# Patient Record
Sex: Male | Born: 1950 | Race: Black or African American | Hispanic: No | Marital: Single | Smoking: Former smoker
Health system: Southern US, Community
[De-identification: ages and names within clinical notes are randomized; demographics above are authoritative.]

## PROBLEM LIST (undated history)

## (undated) DIAGNOSIS — C801 Malignant (primary) neoplasm, unspecified: Secondary | ICD-10-CM

## (undated) DIAGNOSIS — D649 Anemia, unspecified: Secondary | ICD-10-CM

## (undated) DIAGNOSIS — I10 Essential (primary) hypertension: Secondary | ICD-10-CM

## (undated) DIAGNOSIS — I2510 Atherosclerotic heart disease of native coronary artery without angina pectoris: Secondary | ICD-10-CM

## (undated) DIAGNOSIS — Z930 Tracheostomy status: Secondary | ICD-10-CM

---

## 2005-12-24 ENCOUNTER — Emergency Department: Payer: Self-pay | Admitting: Emergency Medicine

## 2020-05-22 DIAGNOSIS — J9621 Acute and chronic respiratory failure with hypoxia: Secondary | ICD-10-CM

## 2020-05-22 DIAGNOSIS — C321 Malignant neoplasm of supraglottis: Secondary | ICD-10-CM | POA: Diagnosis not present

## 2020-05-22 DIAGNOSIS — I779 Disorder of arteries and arterioles, unspecified: Secondary | ICD-10-CM

## 2020-05-22 DIAGNOSIS — Z5181 Encounter for therapeutic drug level monitoring: Secondary | ICD-10-CM | POA: Diagnosis not present

## 2020-05-23 DIAGNOSIS — Z5181 Encounter for therapeutic drug level monitoring: Secondary | ICD-10-CM | POA: Diagnosis not present

## 2020-05-23 DIAGNOSIS — C321 Malignant neoplasm of supraglottis: Secondary | ICD-10-CM | POA: Diagnosis not present

## 2020-05-23 DIAGNOSIS — J9621 Acute and chronic respiratory failure with hypoxia: Secondary | ICD-10-CM | POA: Diagnosis not present

## 2020-05-23 DIAGNOSIS — I779 Disorder of arteries and arterioles, unspecified: Secondary | ICD-10-CM | POA: Diagnosis not present

## 2020-05-24 DIAGNOSIS — I779 Disorder of arteries and arterioles, unspecified: Secondary | ICD-10-CM | POA: Diagnosis not present

## 2020-05-24 DIAGNOSIS — J9621 Acute and chronic respiratory failure with hypoxia: Secondary | ICD-10-CM

## 2020-05-24 DIAGNOSIS — C321 Malignant neoplasm of supraglottis: Secondary | ICD-10-CM | POA: Diagnosis not present

## 2020-05-24 DIAGNOSIS — Z5181 Encounter for therapeutic drug level monitoring: Secondary | ICD-10-CM | POA: Diagnosis not present

## 2020-05-25 DIAGNOSIS — Z5181 Encounter for therapeutic drug level monitoring: Secondary | ICD-10-CM

## 2020-05-25 DIAGNOSIS — C321 Malignant neoplasm of supraglottis: Secondary | ICD-10-CM | POA: Diagnosis not present

## 2020-05-25 DIAGNOSIS — I779 Disorder of arteries and arterioles, unspecified: Secondary | ICD-10-CM

## 2020-05-25 DIAGNOSIS — J9621 Acute and chronic respiratory failure with hypoxia: Secondary | ICD-10-CM

## 2020-05-26 DIAGNOSIS — C321 Malignant neoplasm of supraglottis: Secondary | ICD-10-CM | POA: Diagnosis not present

## 2020-05-26 DIAGNOSIS — Z5181 Encounter for therapeutic drug level monitoring: Secondary | ICD-10-CM

## 2020-05-26 DIAGNOSIS — J9621 Acute and chronic respiratory failure with hypoxia: Secondary | ICD-10-CM

## 2020-05-26 DIAGNOSIS — I779 Disorder of arteries and arterioles, unspecified: Secondary | ICD-10-CM

## 2020-05-27 DIAGNOSIS — I779 Disorder of arteries and arterioles, unspecified: Secondary | ICD-10-CM | POA: Diagnosis not present

## 2020-05-27 DIAGNOSIS — Z5181 Encounter for therapeutic drug level monitoring: Secondary | ICD-10-CM

## 2020-05-27 DIAGNOSIS — C321 Malignant neoplasm of supraglottis: Secondary | ICD-10-CM

## 2020-05-27 DIAGNOSIS — J9621 Acute and chronic respiratory failure with hypoxia: Secondary | ICD-10-CM | POA: Diagnosis not present

## 2020-05-28 DIAGNOSIS — Z5181 Encounter for therapeutic drug level monitoring: Secondary | ICD-10-CM

## 2020-05-28 DIAGNOSIS — C321 Malignant neoplasm of supraglottis: Secondary | ICD-10-CM | POA: Diagnosis not present

## 2020-05-28 DIAGNOSIS — I779 Disorder of arteries and arterioles, unspecified: Secondary | ICD-10-CM | POA: Diagnosis not present

## 2020-05-28 DIAGNOSIS — J9621 Acute and chronic respiratory failure with hypoxia: Secondary | ICD-10-CM

## 2020-05-29 DIAGNOSIS — Z5181 Encounter for therapeutic drug level monitoring: Secondary | ICD-10-CM

## 2020-05-29 DIAGNOSIS — C321 Malignant neoplasm of supraglottis: Secondary | ICD-10-CM

## 2020-05-29 DIAGNOSIS — J9621 Acute and chronic respiratory failure with hypoxia: Secondary | ICD-10-CM

## 2020-05-29 DIAGNOSIS — I779 Disorder of arteries and arterioles, unspecified: Secondary | ICD-10-CM

## 2020-05-30 DIAGNOSIS — Z5181 Encounter for therapeutic drug level monitoring: Secondary | ICD-10-CM | POA: Diagnosis not present

## 2020-05-30 DIAGNOSIS — C321 Malignant neoplasm of supraglottis: Secondary | ICD-10-CM

## 2020-05-30 DIAGNOSIS — J9621 Acute and chronic respiratory failure with hypoxia: Secondary | ICD-10-CM | POA: Diagnosis not present

## 2020-05-30 DIAGNOSIS — I779 Disorder of arteries and arterioles, unspecified: Secondary | ICD-10-CM | POA: Diagnosis not present

## 2020-06-07 DIAGNOSIS — Z5181 Encounter for therapeutic drug level monitoring: Secondary | ICD-10-CM | POA: Diagnosis not present

## 2020-06-07 DIAGNOSIS — C321 Malignant neoplasm of supraglottis: Secondary | ICD-10-CM | POA: Diagnosis not present

## 2020-06-07 DIAGNOSIS — J9621 Acute and chronic respiratory failure with hypoxia: Secondary | ICD-10-CM

## 2020-06-07 DIAGNOSIS — I779 Disorder of arteries and arterioles, unspecified: Secondary | ICD-10-CM

## 2020-06-08 DIAGNOSIS — I779 Disorder of arteries and arterioles, unspecified: Secondary | ICD-10-CM

## 2020-06-08 DIAGNOSIS — J9621 Acute and chronic respiratory failure with hypoxia: Secondary | ICD-10-CM | POA: Diagnosis not present

## 2020-06-08 DIAGNOSIS — C321 Malignant neoplasm of supraglottis: Secondary | ICD-10-CM | POA: Diagnosis not present

## 2020-06-08 DIAGNOSIS — Z5181 Encounter for therapeutic drug level monitoring: Secondary | ICD-10-CM | POA: Diagnosis not present

## 2020-06-09 DIAGNOSIS — J9621 Acute and chronic respiratory failure with hypoxia: Secondary | ICD-10-CM | POA: Diagnosis not present

## 2020-06-09 DIAGNOSIS — Z5181 Encounter for therapeutic drug level monitoring: Secondary | ICD-10-CM

## 2020-06-09 DIAGNOSIS — I779 Disorder of arteries and arterioles, unspecified: Secondary | ICD-10-CM | POA: Diagnosis not present

## 2020-06-09 DIAGNOSIS — C321 Malignant neoplasm of supraglottis: Secondary | ICD-10-CM

## 2020-06-10 DIAGNOSIS — C321 Malignant neoplasm of supraglottis: Secondary | ICD-10-CM

## 2020-06-10 DIAGNOSIS — Z5181 Encounter for therapeutic drug level monitoring: Secondary | ICD-10-CM | POA: Diagnosis not present

## 2020-06-10 DIAGNOSIS — J9621 Acute and chronic respiratory failure with hypoxia: Secondary | ICD-10-CM

## 2020-06-10 DIAGNOSIS — I779 Disorder of arteries and arterioles, unspecified: Secondary | ICD-10-CM | POA: Diagnosis not present

## 2020-06-11 DIAGNOSIS — C321 Malignant neoplasm of supraglottis: Secondary | ICD-10-CM

## 2020-06-11 DIAGNOSIS — J9621 Acute and chronic respiratory failure with hypoxia: Secondary | ICD-10-CM | POA: Diagnosis not present

## 2020-06-11 DIAGNOSIS — Z5181 Encounter for therapeutic drug level monitoring: Secondary | ICD-10-CM | POA: Diagnosis not present

## 2020-06-11 DIAGNOSIS — I779 Disorder of arteries and arterioles, unspecified: Secondary | ICD-10-CM

## 2020-06-24 ENCOUNTER — Emergency Department (HOSPITAL_COMMUNITY): Payer: Medicare Other

## 2020-06-24 ENCOUNTER — Inpatient Hospital Stay (HOSPITAL_COMMUNITY)
Admission: EM | Admit: 2020-06-24 | Discharge: 2020-06-28 | DRG: 480 | Disposition: A | Discharge status: Other Acute Inpatient Hospital | Payer: Medicare Other | Attending: Internal Medicine | Admitting: Internal Medicine

## 2020-06-24 ENCOUNTER — Other Ambulatory Visit: Payer: Self-pay

## 2020-06-24 DIAGNOSIS — I110 Hypertensive heart disease with heart failure: Secondary | ICD-10-CM | POA: Diagnosis present

## 2020-06-24 DIAGNOSIS — I482 Chronic atrial fibrillation, unspecified: Secondary | ICD-10-CM | POA: Diagnosis present

## 2020-06-24 DIAGNOSIS — R1310 Dysphagia, unspecified: Secondary | ICD-10-CM | POA: Diagnosis present

## 2020-06-24 DIAGNOSIS — I5032 Chronic diastolic (congestive) heart failure: Secondary | ICD-10-CM | POA: Diagnosis present

## 2020-06-24 DIAGNOSIS — I480 Paroxysmal atrial fibrillation: Secondary | ICD-10-CM | POA: Diagnosis present

## 2020-06-24 DIAGNOSIS — Z951 Presence of aortocoronary bypass graft: Secondary | ICD-10-CM

## 2020-06-24 DIAGNOSIS — E440 Moderate protein-calorie malnutrition: Secondary | ICD-10-CM | POA: Insufficient documentation

## 2020-06-24 DIAGNOSIS — W010XXA Fall on same level from slipping, tripping and stumbling without subsequent striking against object, initial encounter: Secondary | ICD-10-CM | POA: Diagnosis present

## 2020-06-24 DIAGNOSIS — S72141A Displaced intertrochanteric fracture of right femur, initial encounter for closed fracture: Secondary | ICD-10-CM | POA: Diagnosis present

## 2020-06-24 DIAGNOSIS — I5033 Acute on chronic diastolic (congestive) heart failure: Secondary | ICD-10-CM

## 2020-06-24 DIAGNOSIS — Y92121 Bathroom in nursing home as the place of occurrence of the external cause: Secondary | ICD-10-CM

## 2020-06-24 DIAGNOSIS — Z09 Encounter for follow-up examination after completed treatment for conditions other than malignant neoplasm: Secondary | ICD-10-CM

## 2020-06-24 DIAGNOSIS — E785 Hyperlipidemia, unspecified: Secondary | ICD-10-CM | POA: Diagnosis present

## 2020-06-24 DIAGNOSIS — I509 Heart failure, unspecified: Secondary | ICD-10-CM

## 2020-06-24 DIAGNOSIS — J449 Chronic obstructive pulmonary disease, unspecified: Secondary | ICD-10-CM | POA: Diagnosis present

## 2020-06-24 DIAGNOSIS — I10 Essential (primary) hypertension: Secondary | ICD-10-CM | POA: Diagnosis present

## 2020-06-24 DIAGNOSIS — Z20822 Contact with and (suspected) exposure to covid-19: Secondary | ICD-10-CM | POA: Diagnosis present

## 2020-06-24 DIAGNOSIS — I2510 Atherosclerotic heart disease of native coronary artery without angina pectoris: Secondary | ICD-10-CM | POA: Diagnosis present

## 2020-06-24 DIAGNOSIS — S72001A Fracture of unspecified part of neck of right femur, initial encounter for closed fracture: Secondary | ICD-10-CM

## 2020-06-24 DIAGNOSIS — I252 Old myocardial infarction: Secondary | ICD-10-CM

## 2020-06-24 DIAGNOSIS — I5043 Acute on chronic combined systolic (congestive) and diastolic (congestive) heart failure: Secondary | ICD-10-CM | POA: Diagnosis present

## 2020-06-24 DIAGNOSIS — S72091A Other fracture of head and neck of right femur, initial encounter for closed fracture: Secondary | ICD-10-CM | POA: Diagnosis not present

## 2020-06-24 DIAGNOSIS — C091 Malignant neoplasm of tonsillar pillar (anterior) (posterior): Secondary | ICD-10-CM | POA: Diagnosis present

## 2020-06-24 DIAGNOSIS — Z930 Tracheostomy status: Secondary | ICD-10-CM

## 2020-06-24 DIAGNOSIS — E119 Type 2 diabetes mellitus without complications: Secondary | ICD-10-CM | POA: Diagnosis present

## 2020-06-24 DIAGNOSIS — I083 Combined rheumatic disorders of mitral, aortic and tricuspid valves: Secondary | ICD-10-CM | POA: Diagnosis present

## 2020-06-24 DIAGNOSIS — Z6829 Body mass index (BMI) 29.0-29.9, adult: Secondary | ICD-10-CM

## 2020-06-24 DIAGNOSIS — R491 Aphonia: Secondary | ICD-10-CM | POA: Diagnosis present

## 2020-06-24 DIAGNOSIS — Z01810 Encounter for preprocedural cardiovascular examination: Secondary | ICD-10-CM | POA: Diagnosis not present

## 2020-06-24 DIAGNOSIS — T1490XA Injury, unspecified, initial encounter: Secondary | ICD-10-CM

## 2020-06-24 DIAGNOSIS — I340 Nonrheumatic mitral (valve) insufficiency: Secondary | ICD-10-CM | POA: Diagnosis not present

## 2020-06-24 DIAGNOSIS — I361 Nonrheumatic tricuspid (valve) insufficiency: Secondary | ICD-10-CM | POA: Diagnosis not present

## 2020-06-24 DIAGNOSIS — M25551 Pain in right hip: Secondary | ICD-10-CM | POA: Diagnosis present

## 2020-06-24 LAB — CBC WITH DIFFERENTIAL/PLATELET
Abs Immature Granulocytes: 0.01 10*3/uL (ref 0.00–0.07)
Basophils Absolute: 0 10*3/uL (ref 0.0–0.1)
Basophils Relative: 0 %
Eosinophils Absolute: 0 10*3/uL (ref 0.0–0.5)
Eosinophils Relative: 0 %
HCT: 29.2 % — ABNORMAL LOW (ref 39.0–52.0)
Hemoglobin: 9 g/dL — ABNORMAL LOW (ref 13.0–17.0)
Immature Granulocytes: 0 %
Lymphocytes Relative: 11 %
Lymphs Abs: 0.6 10*3/uL — ABNORMAL LOW (ref 0.7–4.0)
MCH: 24.7 pg — ABNORMAL LOW (ref 26.0–34.0)
MCHC: 30.8 g/dL (ref 30.0–36.0)
MCV: 80 fL (ref 80.0–100.0)
Monocytes Absolute: 0.7 10*3/uL (ref 0.1–1.0)
Monocytes Relative: 14 %
Neutro Abs: 3.9 10*3/uL (ref 1.7–7.7)
Neutrophils Relative %: 75 %
Platelets: 253 10*3/uL (ref 150–400)
RBC: 3.65 MIL/uL — ABNORMAL LOW (ref 4.22–5.81)
RDW: 16.8 % — ABNORMAL HIGH (ref 11.5–15.5)
WBC: 5.2 10*3/uL (ref 4.0–10.5)
nRBC: 0 % (ref 0.0–0.2)

## 2020-06-24 LAB — BASIC METABOLIC PANEL
Anion gap: 8 (ref 5–15)
BUN: 21 mg/dL (ref 8–23)
CO2: 30 mmol/L (ref 22–32)
Calcium: 9.1 mg/dL (ref 8.9–10.3)
Chloride: 97 mmol/L — ABNORMAL LOW (ref 98–111)
Creatinine, Ser: 0.72 mg/dL (ref 0.61–1.24)
GFR calc Af Amer: >60 mL/min (ref >60)
GFR calc non Af Amer: >60 mL/min (ref >60)
Glucose, Bld: 102 mg/dL — ABNORMAL HIGH (ref 70–99)
Potassium: 4.9 mmol/L (ref 3.5–5.1)
Sodium: 135 mmol/L (ref 135–145)

## 2020-06-24 LAB — TYPE AND SCREEN
ABO/RH(D): O POS
Antibody Screen: NEGATIVE

## 2020-06-24 LAB — SARS CORONAVIRUS 2 BY RT PCR (HOSPITAL ORDER, PERFORMED IN ~~LOC~~ HOSPITAL LAB): SARS Coronavirus 2: NEGATIVE

## 2020-06-24 LAB — HIV ANTIBODY (ROUTINE TESTING W REFLEX): HIV Screen 4th Generation wRfx: NONREACTIVE

## 2020-06-24 LAB — BRAIN NATRIURETIC PEPTIDE: B Natriuretic Peptide: 187.9 pg/mL — ABNORMAL HIGH (ref 0.0–100.0)

## 2020-06-24 LAB — ABO/RH: ABO/RH(D): O POS

## 2020-06-24 MED ORDER — HYDROCODONE-ACETAMINOPHEN 5-325 MG PO TABS
1.0000 | ORAL_TABLET | Freq: Four times a day (QID) | ORAL | Status: DC | PRN
  Administered 2020-06-25: 2 via ORAL
  Filled 2020-06-24: qty 2

## 2020-06-24 MED ORDER — HYDRALAZINE HCL 20 MG/ML IJ SOLN: 5.0000 mg | Freq: Four times a day (QID) | INTRAMUSCULAR | Status: DC | PRN

## 2020-06-24 MED ORDER — ENOXAPARIN SODIUM 40 MG/0.4ML ~~LOC~~ SOLN
40.0000 mg | SUBCUTANEOUS | Status: DC
  Filled 2020-06-24: qty 0.4

## 2020-06-24 MED ORDER — CARVEDILOL 25 MG PO TABS
25.0000 mg | ORAL_TABLET | Freq: Two times a day (BID) | ORAL | Status: DC
  Administered 2020-06-25 – 2020-06-28 (×7): 25 mg via ORAL
  Filled 2020-06-24 (×7): qty 1

## 2020-06-24 MED ORDER — IPRATROPIUM-ALBUTEROL 0.5-2.5 (3) MG/3ML IN SOLN
RESPIRATORY_TRACT | Status: AC
  Filled 2020-06-24: qty 3

## 2020-06-24 MED ORDER — FUROSEMIDE 40 MG PO TABS
80.0000 mg | ORAL_TABLET | Freq: Every day | ORAL | Status: DC
  Administered 2020-06-25 – 2020-06-28 (×4): 80 mg via ORAL
  Filled 2020-06-24 (×4): qty 2

## 2020-06-24 MED ORDER — FUROSEMIDE 20 MG PO TABS: 80.0000 mg | ORAL_TABLET | Freq: Every day | ORAL | Status: DC

## 2020-06-24 MED ORDER — ONDANSETRON HCL 4 MG/2ML IJ SOLN
4.0000 mg | Freq: Four times a day (QID) | INTRAMUSCULAR | Status: DC | PRN
  Administered 2020-06-25: 4 mg via INTRAVENOUS

## 2020-06-24 MED ORDER — MOMETASONE FURO-FORMOTEROL FUM 100-5 MCG/ACT IN AERO
2.0000 | INHALATION_SPRAY | Freq: Two times a day (BID) | RESPIRATORY_TRACT | Status: DC
  Administered 2020-06-24: 2 via RESPIRATORY_TRACT
  Filled 2020-06-24: qty 8.8

## 2020-06-24 MED ORDER — LISINOPRIL 5 MG PO TABS
5.0000 mg | ORAL_TABLET | Freq: Every day | ORAL | Status: DC
  Administered 2020-06-24 – 2020-06-28 (×5): 5 mg via ORAL
  Filled 2020-06-24 (×5): qty 1

## 2020-06-24 MED ORDER — SODIUM CHLORIDE 0.9 % IV SOLN: INTRAVENOUS | Status: DC

## 2020-06-24 MED ORDER — IPRATROPIUM-ALBUTEROL 0.5-2.5 (3) MG/3ML IN SOLN
3.0000 mL | Freq: Four times a day (QID) | RESPIRATORY_TRACT | Status: DC | PRN
  Administered 2020-06-24 – 2020-06-25 (×2): 3 mL via RESPIRATORY_TRACT
  Filled 2020-06-24: qty 3

## 2020-06-24 MED ORDER — CEFAZOLIN SODIUM-DEXTROSE 2-4 GM/100ML-% IV SOLN
2.0000 g | INTRAVENOUS | Status: AC
  Administered 2020-06-25: 2 g via INTRAVENOUS

## 2020-06-24 MED ORDER — ATORVASTATIN CALCIUM 80 MG PO TABS
80.0000 mg | ORAL_TABLET | Freq: Every day | ORAL | Status: DC
  Administered 2020-06-24 – 2020-06-28 (×5): 80 mg via ORAL
  Filled 2020-06-24 (×5): qty 1

## 2020-06-24 MED ORDER — ACETYLCYSTEINE 20 % IN SOLN
4.0000 mL | Freq: Three times a day (TID) | RESPIRATORY_TRACT | Status: DC
  Administered 2020-06-24 – 2020-06-25 (×2): 4 mL via RESPIRATORY_TRACT
  Filled 2020-06-24 (×4): qty 4

## 2020-06-24 MED ORDER — FUROSEMIDE 10 MG/ML IJ SOLN
80.0000 mg | Freq: Once | INTRAMUSCULAR | Status: AC
  Administered 2020-06-24: 80 mg via INTRAVENOUS
  Filled 2020-06-24: qty 8

## 2020-06-24 MED ORDER — MORPHINE SULFATE (PF) 4 MG/ML IV SOLN
4.0000 mg | INTRAVENOUS | Status: AC | PRN
  Administered 2020-06-24 – 2020-06-25 (×2): 4 mg via INTRAVENOUS
  Filled 2020-06-24 (×2): qty 1

## 2020-06-24 NOTE — H&P (View-Only) (Signed)
ORTHOPAEDIC CONSULTATION  REQUESTING PHYSICIAN: Lequita Halt, MD  Chief Complaint: right hip pain  HPI: Paul Lowery is a 69 y.o. 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  Patient currently resides at [place]. He had a reported fall last night. He was taken to [facility] Emergency Department today by EMS. Xrays in emergency department showed right intertrochanteric femur fracture. Orthopedics was consulted.   Difficult to obtain history due to patient's aphonia and tracheostomy. Patient notes he is only have pain in his right hip. He notes he has surgery on his left lower extremity in the past, but difficult to get more information about this. Pain has improved with IV pain medication. Resides at [place]. Normally ambulates with cane/walker.  No past medical history on file.  Social History   Socioeconomic History  . Marital status: Unknown    Spouse name: Not on file  . Number of children: Not on file  . Years of education: Not on file  . Highest education level: Not on file  Occupational History  . Not on file  Tobacco Use  . Smoking status: Not on file  Substance and Sexual Activity  . Alcohol use: Not on file  . Drug use: Not on file  . Sexual activity: Not on file  Other Topics Concern  . Not on file  Social History Narrative  . Not on file   Social Determinants of Health   Financial Resource Strain:   . Difficulty of Paying Living Expenses: Not on file  Food Insecurity:   . Worried About Charity fundraiser in the Last Year: Not on file  . Ran Out of Food in the Last Year: Not on file  Transportation Needs:   . Lack of Transportation (Medical): Not on file  . Lack of Transportation (Non-Medical): Not on file  Physical Activity:   . Days of Exercise per  Week: Not on file  . Minutes of Exercise per Session: Not on file  Stress:   . Feeling of Stress : Not on file  Social Connections:   . Frequency of Communication with Friends and Family: Not on file  . Frequency of Social Gatherings with Friends and Family: Not on file  . Attends Religious Services: Not on file  . Active Member of Clubs or Organizations: Not on file  . Attends Archivist Meetings: Not on file  . Marital Status: Not on file   No family history on file. No Known Allergies Prior to Admission medications   Not on File   DG Chest 1 View  Result Date: 06/24/2020 CLINICAL DATA:  Fall, hip fracture EXAM: CHEST  1 VIEW COMPARISON:  Radiograph 04/15/2018 FINDINGS: Endotracheal tube terminates in the mid trachea, approximately 5.5 cm from the level of the carina. There are postsurgical changes from prior sternotomy and CABG including reinforced inferior sternal sutures. Telemetry leads overlie the chest. Cardiomegaly similar to comparison exams. Aorta is calcified. There are diffuse hazy opacities throughout the lungs with a slight basilar gradient as well as mild cephalization and indistinctness of the pulmonary vascularity. Suspect some mild septal thickening seen in the periphery. No pneumothorax or visible effusion. No focal consolidation. No acute osseous or soft tissue abnormality. Degenerative changes are present in the imaged spine and shoulders. IMPRESSION: 1. Endotracheal tube terminates in the mid trachea, approximately 5.5  cm from the level of the carina. 2. Findings could suggest CHF with mild pulmonary edema and stable cardiomegaly. 3. No visible pleural effusion, pneumothorax or focal consolidative process. 4. Prior sternotomy and CABG. 5.  Aortic Atherosclerosis (ICD10-I70.0). Electronically Signed   By: Lovena Le M.D.   On: 06/24/2020 17:01   DG Hip Unilat With Pelvis 2-3 Views Right  Result Date: 06/24/2020 CLINICAL DATA:  Positive hip fracture, mechanical  fall last night. EXAM: DG HIP (WITH OR WITHOUT PELVIS) 2-3V RIGHT COMPARISON:  None. FINDINGS: Comminuted intertrochanteric fracture of the right femur. Partially visualized surgical changes of the proximal left femur. Frontal views of the left femur otherwise grossly unremarkable. No hip dislocation bilaterally. No acute fracture of the bones of the pelvis. No diastasis. IMPRESSION: Comminuted intertrochanteric right femoral fracture. Electronically Signed   By: Iven Finn M.D.   On: 06/24/2020 17:03   Family History Reviewed and non-contributory, no pertinent history of problems with bleeding or anesthesia      Review of Systems 14 system ROS conducted and negative except for that noted in HPI   OBJECTIVE  Vitals: Patient Vitals for the past 8 hrs:  BP Temp Temp src Pulse Resp SpO2  06/24/20 1815 (!) 146/75 -- -- 67 15 96 %  06/24/20 1715 (!) 161/81 -- -- 81 11 98 %  06/24/20 1645 (!) 146/78 -- -- 83 (!) 27 99 %  06/24/20 1630 (!) 150/87 -- -- 70 (!) 25 95 %  06/24/20 1624 (!) 160/72 98.8 F (37.1 C) Oral 68 16 98 %   General: Alert, no acute distress Cardiovascular: Warm extremities noted Respiratory: Tracheostomy in place, No cyanosis, no use of accessory musculature GI: No organomegaly, abdomen is soft and non-tender Skin: No lesions in the area of chief complaint other than those listed below in MSK exam.  Neurologic: Sensation intact distally save for the below mentioned MSK exam Psychiatric: Patient is competent for consent with normal mood and affect Lymphatic: Mild lower extremity edema  Extremities  Bilateral upper extremities: actively moves upper extremities without any pain. NVI.  RLE: Slightly shortened and externally rotated. ROM deferred. Distal motor and sensory function intact. 2+ DP pulse with warm and well perfused digits. Compartments soft and compressible, with no pain on passive stretch. LLE: nontender to palpation throughout LLE. Tolerates gentle ROM.  NVI.     Test Results Imaging Xrays right hip show comminuted intertrochanteric femur fracture. Limited viewing of previous surgical fixation on left femur.   Labs cbc Recent Labs    06/24/20 1732  WBC 5.2  HGB 9.0*  HCT 29.2*  PLT 253    Labs inflam No results for input(s): CRP in the last 72 hours.  Invalid input(s): ESR  Labs coag No results for input(s): INR, PTT in the last 72 hours.  Invalid input(s): PT  Recent Labs    06/24/20 1732  NA 135  K 4.9  CL 97*  CO2 30  GLUCOSE 102*  BUN 21  CREATININE 0.72  CALCIUM 9.1     ASSESSMENT AND PLAN: 69 y.o. male with the following: right intertrochanteric femur fracture  Orthopedics recommends admission to a medical service and we will provide consultation and follow along.  Discussed the nature of the injury as well as the care with the patient as well as the family.  Discussed options and non-operative versus operative measures. Nonoperative measures are not well tolerated as patient's on bedrest for extended periods of time tend to develop secondary issues such as  pneumonia, urinary tract infections, bedsores and delirium.  Based on this our recommendation is for operative measures.  Understanding this the patient/family elected to proceed with operative measures.  The risks and benefits of  surgical intervention including infection, bleeding, nerve injury, periprosthetic fracture, the need for revision surgery, leg length discrepancy, gait change, blood clots, cardiopulmonary complications, morbidity, mortality, among others, and they were willing to proceed.    - Plan: Operative fixation tomorrow if cleared by hospitalist team - NPO at midnight -Medicine team to admit and perform pre-op clearance - Weight Bearing Status/Activity: will ammend WB status postop, bedrest for now - PT/OT post op - VTE Prophylaxis: SCDs for now - Pain control: PRN pain medications - Dispo: Likely to require Rehab or SNF placement  upon discharge.  - Contact information: Dr. Ophelia Charter, Noemi Chapel, PA-C   [place], [place] 06/24/2020 8:26 PM

## 2020-06-24 NOTE — Consult Note (Signed)
Cardiology Consultation:   Patient ID: Paul Lowery MRN: [id number]; DOB: 1951-08-02  Admit date: 06/24/2020 Date of Consult: 06/25/2020  Primary Care Provider: Benito Mccreedy, MD [facility] Cardiologist: None Primary cardiologist: Mamie Nick ([place]) [place] Electrophysiologist:  None    Patient Profile:   Paul Lowery is a 69 y.o. male with a hx of three-vessel CABG in 2019, ischemic cardiomyopathy/heart failure with midrange ejection fraction, hypertension, hyperlipidemia, chronic intermittent atrial fibrillation, COPD on room air, type 2 diabetes on oral therapy, and recently diagnosed tonsillar squamous cell carcinoma (T3N3M1 supraglottic undifferentiated carcinoma with right hilar avidity suspicious although not biopsy-proven for metastasis, status post palliative radiation and tracheostomy and PEG tube) who is being seen today for the evaluation of heart failure at the request of Dr. Roosevelt Locks.  History of Present Illness:   Paul Lowery was brought to the emergency department from [facility] today after a fall and was found to have right hip comminuted intertrochanteric fracture.  Emergency department notes indicate a tentative plan for surgery tomorrow (9/17) afternoon.  Cardiology is consulted for preoperative risk stratification and recommendations regarding medical management of chronic cardiac conditions.  Review of care everywhere records indicates that he suffered NSTEMI and subsequently underwent CABG on 03/05/2018 by Dr. Augustin Coupe at [facility].  He has a LIMA graft to his LAD and vein grafts to a right posterior lateral branch and OM 2.  Echo at the time of CABG showed LVEF 45 to 50%, regional wall motion abnormalities in the posterior and lateral walls, and no major valvular abnormalities. He developed atrial fibrillation in the postoperative period.  He was treated with Eliquis for stroke prevention for approximately 6 months after surgery, this was  subsequently discontinued as he had no recurrent atrial fibrillation.  Chronic medications include beta-blocker, ACE inhibitor, and Lasix 80 mg daily.    I interviewed Paul Lowery with his speaking valve in place.  Paul Lowery reports that he had a mechanical fall leading to his hip fracture.  He otherwise feels at baseline.  He has no exertional chest pain and does not currently have any chest pain, dyspnea, lightheadedness, or dizziness.  He takes Lasix daily and does not feel volume overloaded.  He does have a cough productive of scant sputum from his trach.  Home Medications:  . acetylcysteine (MUCOMYST) 10 % nebulizer solution Take 10 mLs by nebulization every 8 (eight) hours as needed 30 mL 0  . atorvastatin (LIPITOR) 80 MG tablet Take 1 tablet (80 mg total) by G tube once daily 30 tablet 0  . budesonide (PULMICORT) 0.5 mg/2 mL nebulizer solution Take 2 mLs (0.5 mg total) by nebulization 2 (two) times daily 60 mL 0  . carvedilol (COREG) 25 MG tablet Take 25 mg by G tube 2 (two) times daily with meals 0  . fluticasone propionate (FLONASE) 50 mcg/actuation nasal spray Place 2 sprays into both nostrils 2 (two) times daily as needed (congestion) 16 g 0  . formoterol (PERFOROMIST) 20 mcg/2 mL nebulizer solution Take 2 mLs (20 mcg total) by nebulization 2 (two) times daily 120 mL 0  . FUROsemide (LASIX) 80 MG tablet Take 1 tablet (80 mg total) by G tube once daily 0  . ipratropium-albuteroL (DUO-NEB) nebulizer solution Take 3 mLs by nebulization every 4 (four) hours as needed for Wheezing or Shortness of Breath  . lisinopriL (ZESTRIL) 5 MG tablet Take 5 mg by G tube once daily  . oxyCODONE (ROXICODONE) 5 MG immediate release tablet Take 1-2 tablets (5-10  mg total) by G tube every 4 (four) hours as needed for Pain (1 tablet (5 mg) for moderate pain. 2 tablets (10 mg) for severe pain) 20 tablet 0  . scopolamine (TRANSDERM-SCOP 1.5 MG) 1 mg over 3 days patch Place 1 patch (1.5 mg total) onto the skin every  third day as needed (secretions) for up to 30 days 10 patch 0  . sennosides-docusate (SENOKOT-S) 8.6-50 mg tablet Take 2 tablets by mouth 2 (two) times daily 0  . albuterol (PROVENTIL) 2.5 mg /3 mL (0.083 %) nebulizer solution Take 3 mLs (2.5 mg total) by nebulization 3 (three) times daily as needed for Wheezing or Shortness of Breath 75 mL 12  . sodium chloride (OCEAN) 0.65 % nasal spray Place 1 spray into both nostrils every 2 (two) hours as needed (Patient not taking: Reported on 06/16/2020 ) 0    Inpatient Medications: Scheduled Meds: . acetylcysteine  4 mL Nebulization TID  . atorvastatin  80 mg Oral Daily  . carvedilol  25 mg Oral BID WC  . enoxaparin (LOVENOX) injection  40 mg Subcutaneous Q24H  . furosemide  80 mg Oral Daily  . lisinopril  5 mg Oral Daily  . mometasone-formoterol  2 puff Inhalation BID   Continuous Infusions: .  ceFAZolin (ANCEF) IV     PRN Meds: hydrALAZINE, HYDROcodone-acetaminophen, ipratropium-albuterol, ondansetron (ZOFRAN) IV  Allergies:   No Known Allergies  Social History:   Social History   Socioeconomic History  . Marital status: Unknown    Spouse name: Not on file  . Number of children: Not on file  . Years of education: Not on file  . Highest education level: Not on file  Occupational History  . Not on file  Tobacco Use  . Smoking status: Not on file  Substance and Sexual Activity  . Alcohol use: Not on file  . Drug use: Not on file  . Sexual activity: Not on file  Other Topics Concern  . Not on file  Social History Narrative  . Not on file   Social Determinants of Health   Financial Resource Strain:   . Difficulty of Paying Living Expenses: Not on file  Food Insecurity:   . Worried About Charity fundraiser in the Last Year: Not on file  . Ran Out of Food in the Last Year: Not on file  Transportation Needs:   . Lack of Transportation (Medical): Not on file  . Lack of Transportation (Non-Medical): Not on file  Physical  Activity:   . Days of Exercise per Week: Not on file  . Minutes of Exercise per Session: Not on file  Stress:   . Feeling of Stress : Not on file  Social Connections:   . Frequency of Communication with Friends and Family: Not on file  . Frequency of Social Gatherings with Friends and Family: Not on file  . Attends Religious Services: Not on file  . Active Member of Clubs or Organizations: Not on file  . Attends Archivist Meetings: Not on file  . Marital Status: Not on file  Intimate Partner Violence:   . Fear of Current or Ex-Partner: Not on file  . Emotionally Abused: Not on file  . Physically Abused: Not on file  . Sexually Abused: Not on file    ROS:  Please see the history of present illness.  All other ROS reviewed and negative.     Physical Exam/Data:   Vitals:   06/24/20 2230 06/24/20 2234 06/24/20 2245  06/24/20 2332  BP: (!) 141/84  (!) 147/83 (!) 142/67  Pulse: (!) 45 93 92 62  Resp:   16   Temp:    98.5 F (36.9 C)  TempSrc:    Oral  SpO2: 92% 96% 95% 92%    Intake/Output Summary (Last 24 hours) at 06/25/2020 0146 Last data filed at 06/24/2020 2356 Gross per 24 hour  Intake --  Output 350 ml  Net -350 ml   General: Frail-appearing elderly gentleman with trach in place, awake and alert HEENT: Trach in place, with scant secretions present, surrounding skin site well-healed Neck: Bounding carotid pulses, no JVD, radiation related skin changes Endocrine:  No thryomegaly Vascular: No carotid bruits; FA pulses 2+ bilaterally without bruits  Cardiac: Irregularly irregular no murmur  Lungs: Upper airway sounds, lungs otherwise clear to auscultation bilaterally, no wheezing, rhonchi or rales  Abd: soft, nontender, no hepatomegaly  Ext: no edema Musculoskeletal:  No deformities, BUE and BLE strength normal and equal Skin: warm and dry  Neuro:  CNs 2-12 intact, no focal abnormalities noted Psych:  Normal affect,   EKG:  The EKG was personally reviewed  and demonstrates: Atrial fibrillation with right bundle branch block and PVC.  Rate is 82 bpm and QRS duration is 150 ms.  Telemetry:  Telemetry was personally reviewed and demonstrates: Atrial fibrillation  Relevant CV Studies:  CARDIAC CATH:02/25/2018 ([facility])  RCA: 40% serial lesions in prox/midRCA 70% rPAV just distal to the rPDA bifurcation, 60% PL2 lesion 60-70% rPDA lesion  LCx: 70-80% midLCx just before large ectatic coronary segment (best seen from LAO cranial projection) 70% OM3  LAD: 80% midLAD (instent) 90% ostial D2 (normal size) 60-70% distal LAD Chemistry    Transthoracic echo 02/2018 [id number]/1027 MILD LV SYSTOLIC DYSFUNCTION (LVEF 45 to 50%)  WITH MODERATE LVH NORMAL RIGHT VENTRICULAR SYSTOLIC FUNCTION NO VALVULAR STENOSIS POOR SOUND TRANSMISSION MILD DILATION OF THE ASCENDING AORTA LEFT VENTRICLE IS NOT DILATED. OVERALL LEFT VENTRICULAR EJECTION FRACTION IS IN THE 45- 50% RANGE. SOUND TRANSMISSION IS POOR AND DETAIL REGIONAL WALL MOTION ANALYSIS WAS DIFFICULT. HOWEVER THE LATERAL WALL AND POSTERIOR WALLS ARE HYPOKINETIC AND PART OF THE APEX IS ALSO HYPOKINETIC. RVSP 45 mmhg   Laboratory Data:  High Sensitivity Troponin:  No results for input(s): TROPONINIHS in the last 720 hours.   Chemistry Recent Labs  Lab 06/24/20 1732  NA 135  K 4.9  CL 97*  CO2 30  GLUCOSE 102*  BUN 21  CREATININE 0.72  CALCIUM 9.1  GFRNONAA >60  GFRAA >60  ANIONGAP 8    No results for input(s): PROT, ALBUMIN, AST, ALT, ALKPHOS, BILITOT in the last 168 hours. Hematology Recent Labs  Lab 06/24/20 1732  WBC 5.2  RBC 3.65*  HGB 9.0*  HCT 29.2*  MCV 80.0  MCH 24.7*  MCHC 30.8  RDW 16.8*  PLT 253   BNP Recent Labs  Lab 06/24/20 1732  BNP 187.9*    DDimer No results for input(s): DDIMER in the last 168 hours.   Radiology/Studies:  DG Chest 1 View  Result Date: 06/24/2020 CLINICAL DATA:  Fall, hip fracture EXAM: CHEST  1 VIEW COMPARISON:   Radiograph 04/15/2018 FINDINGS: Endotracheal tube terminates in the mid trachea, approximately 5.5 cm from the level of the carina. There are postsurgical changes from prior sternotomy and CABG including reinforced inferior sternal sutures. Telemetry leads overlie the chest. Cardiomegaly similar to comparison exams. Aorta is calcified. There are diffuse hazy opacities throughout the lungs with a slight basilar gradient as  well as mild cephalization and indistinctness of the pulmonary vascularity. Suspect some mild septal thickening seen in the periphery. No pneumothorax or visible effusion. No focal consolidation. No acute osseous or soft tissue abnormality. Degenerative changes are present in the imaged spine and shoulders. IMPRESSION: 1. Endotracheal tube terminates in the mid trachea, approximately 5.5 cm from the level of the carina. 2. Findings could suggest CHF with mild pulmonary edema and stable cardiomegaly. 3. No visible pleural effusion, pneumothorax or focal consolidative process. 4. Prior sternotomy and CABG. 5.  Aortic Atherosclerosis (ICD10-I70.0). Electronically Signed   By: Lovena Le M.D.   On: 06/24/2020 17:01   DG Hip Unilat With Pelvis 2-3 Views Right  Result Date: 06/24/2020 CLINICAL DATA:  Positive hip fracture, mechanical fall last night. EXAM: DG HIP (WITH OR WITHOUT PELVIS) 2-3V RIGHT COMPARISON:  None. FINDINGS: Comminuted intertrochanteric fracture of the right femur. Partially visualized surgical changes of the proximal left femur. Frontal views of the left femur otherwise grossly unremarkable. No hip dislocation bilaterally. No acute fracture of the bones of the pelvis. No diastasis. IMPRESSION: Comminuted intertrochanteric right femoral fracture. Electronically Signed   By: Iven Finn M.D.   On: 06/24/2020 17:03    Assessment and Plan:   Mr. Watkinson was admitted with a hip fracture.  He has a history of three-vessel coronary artery bypass, paroxysmal atrial fibrillation  not on anticoagulation, and compensated heart failure.  He does not currently have any findings of active cardiac ischemia or decompensated heart failure.  RCRI score is 1 (history of ischemic heart disease) indicating a 10.1% risk of 30-day perioperative death MI or cardiac arrest.  He does not require additional testing or interventions to optimize operative risk.  Additionally, he has had recurrence of paroxysmal atrial fibrillation, which was previously thought to have been isolated to the perioperative cardiac surgery time and to have resolved.  He is not currently on anticoagulation.  He would benefit from anticoagulation for stroke prevention, however I am uncertain to what degree systemic anticoagulation would increase his risk of bleeding related to tonsillar cancer.  This should be discussed with his oncologist and cardiologist after discharge.  1. Coronary artery disease -Recommend continuing beta-blockers through the perioperative period (carvedilol 25 mg twice daily)  -Continue lisinopril, low threshold to hold it if he develops hypotension or AKI. -Continue atorvastatin  2.  Paroxysmal atrial fibrillation -Hold anticoagulation in the perioperative time. -This should be addressed in the outpatient setting  [place] will sign off.    Follow up as an outpatient: [place]  For questions or updates, please contact [place] Please consult [email or web address] for contact info under    Signed, Osvaldo Shipper, MD  06/25/2020 1:46 AM

## 2020-06-24 NOTE — ED Triage Notes (Signed)
Patient arrived via EMS; from [place]. Reported mechanical fall last night; normally ambulatory w/ assistance. Trach in place; typically on room air and is placed on supplemental o2 d/t increasing secretions. Reported patient had XRAYs done today and + R femur fx.

## 2020-06-24 NOTE — ED Provider Notes (Signed)
[facility] EMERGENCY DEPARTMENT Provider Note   CSN: [id number] Arrival date & time: 06/24/20  1548     History Chief Complaint  Patient presents with  . Fall    Paul Lowery is a 69 y.o. male.  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  Patient is currently residing at [facility].  He is not able to contribute to history due to aphonia so history mainly obtained from EMS report.  Patient reportedly fell last night.  X-rays performed today showed right hip fracture.  Level 5 caveat due to communication difficulties and aphonia.        No past medical history on file.  There are no problems to display for this patient.   The histories are not reviewed yet. Please review them in the "History" navigator section and refresh this [place].     No family history on file.  Social History   Tobacco Use  . Smoking status: Not on file  Substance Use Topics  . Alcohol use: Not on file  . Drug use: Not on file    Home Medications Prior to Admission medications   Not on File    Allergies    Patient has no known allergies.  Review of Systems   Review of Systems  Musculoskeletal: Positive for arthralgias.    Physical Exam Updated Vital Signs BP (!) 150/87   Pulse 70   Temp 98.8 F (37.1 C) (Oral)   Resp (!) 25   SpO2 95%   Physical Exam Vitals and nursing note reviewed.  Constitutional:      Appearance: He is well-developed.  HENT:     Head: Normocephalic and atraumatic.  Eyes:     General:        Right eye: No discharge.        Left eye: No discharge.     Conjunctiva/sclera: Conjunctivae normal.  Neck:     Comments: Trach in place, suctioned at bedside with mild secretion.  Cardiovascular:     Rate and Rhythm: Normal  rate and regular rhythm.     Pulses:          Posterior tibial pulses are 2+ on the right side and 2+ on the left side.     Heart sounds: Normal heart sounds.  Pulmonary:     Effort: Pulmonary effort is normal.     Breath sounds: Normal breath sounds.  Abdominal:     Palpations: Abdomen is soft.     Tenderness: There is no abdominal tenderness. There is no guarding or rebound.  Musculoskeletal:     Cervical back: Normal range of motion and neck supple.     Right hip: Tenderness present. Decreased range of motion.     Right knee: No deformity, effusion or bony tenderness. No tenderness.     Right ankle: No tenderness. Normal range of motion.  Skin:    General: Skin is warm and dry.  Neurological:     General: No focal deficit present.     Mental Status: He is alert. Mental status is at baseline.     Comments: Patient wiggles toes on both feet on command.      ED Results / Procedures / Treatments   Labs (all labs ordered are listed, but only abnormal results are displayed) Labs Reviewed  BASIC METABOLIC PANEL -  Abnormal; Notable for the following components:      Result Value   Chloride 97 (*)    Glucose, Bld 102 (*)    All other components within normal limits  CBC WITH DIFFERENTIAL/PLATELET - Abnormal; Notable for the following components:   RBC 3.65 (*)    Hemoglobin 9.0 (*)    HCT 29.2 (*)    MCH 24.7 (*)    RDW 16.8 (*)    Lymphs Abs 0.6 (*)    All other components within normal limits  SARS CORONAVIRUS 2 BY RT PCR (HOSPITAL ORDER, [facility])  BRAIN NATRIURETIC PEPTIDE  TYPE AND SCREEN  ABO/RH    EKG EKG Interpretation  Date/Time:  Thursday June 24 2020 15:58:15 EDT Ventricular Rate:  82 PR Interval:    QRS Duration: 152 QT Interval:  416 QTC Calculation: 486 R Axis:   -84 Text Interpretation: Atrial fibrillation Ventricular premature complex RBBB and LAFB Inferior infarct, old Lateral leads are also involved No old tracing  to compare Confirmed by Dorie Rank [phone]) on 06/24/2020 4:48:52 PM   Radiology DG Chest 1 View  Result Date: 06/24/2020 CLINICAL DATA:  Fall, hip fracture EXAM: CHEST  1 VIEW COMPARISON:  Radiograph 04/15/2018 FINDINGS: Endotracheal tube terminates in the mid trachea, approximately 5.5 cm from the level of the carina. There are postsurgical changes from prior sternotomy and CABG including reinforced inferior sternal sutures. Telemetry leads overlie the chest. Cardiomegaly similar to comparison exams. Aorta is calcified. There are diffuse hazy opacities throughout the lungs with a slight basilar gradient as well as mild cephalization and indistinctness of the pulmonary vascularity. Suspect some mild septal thickening seen in the periphery. No pneumothorax or visible effusion. No focal consolidation. No acute osseous or soft tissue abnormality. Degenerative changes are present in the imaged spine and shoulders. IMPRESSION: 1. Endotracheal tube terminates in the mid trachea, approximately 5.5 cm from the level of the carina. 2. Findings could suggest CHF with mild pulmonary edema and stable cardiomegaly. 3. No visible pleural effusion, pneumothorax or focal consolidative process. 4. Prior sternotomy and CABG. 5.  Aortic Atherosclerosis (ICD10-I70.0). Electronically Signed   By: Lovena Le M.D.   On: 06/24/2020 17:01   DG Hip Unilat With Pelvis 2-3 Views Right  Result Date: 06/24/2020 CLINICAL DATA:  Positive hip fracture, mechanical fall last night. EXAM: DG HIP (WITH OR WITHOUT PELVIS) 2-3V RIGHT COMPARISON:  None. FINDINGS: Comminuted intertrochanteric fracture of the right femur. Partially visualized surgical changes of the proximal left femur. Frontal views of the left femur otherwise grossly unremarkable. No hip dislocation bilaterally. No acute fracture of the bones of the pelvis. No diastasis. IMPRESSION: Comminuted intertrochanteric right femoral fracture. Electronically Signed   By: Iven Finn  M.D.   On: 06/24/2020 17:03    Procedures Procedures (including critical care time)  Medications Ordered in ED Medications  morphine 4 MG/ML injection 4 mg (has no administration in time range)  ondansetron (ZOFRAN) injection 4 mg (has no administration in time range)    ED Course  I have reviewed the triage vital signs and the nursing notes.  Pertinent labs & imaging results that were available during my care of the patient were reviewed by me and considered in my medical decision making (see chart for details).  Patient seen and examined. History mainly obtained by EMS as it is difficult to speak with patient 2/2 tracheostomy.  Hip fx work-up initiated. Medications ordered.   Vital signs reviewed and are as follows:  BP (!) 150/87   Pulse 70   Temp 98.8 F (37.1 C) (Oral)   Resp (!) 25   SpO2 95%   5:19 PM X-rays reviewed. Pt updated. I spoke with the patient's daughter [name] by telephone and updated her as well. Will consult orthopedics.   5:30 PM I spoke with Dr. Griffin Basil. Plan tentatively plan for surgery tomorrow afternoon.   6:08 PM Labs reviewed. Will call for admission. Hgb is 9.0 but over the past 6 months hgb has been in the 7's and 8's per [facility].   6:24 PM Spoke with Dr. Roosevelt Locks with [facility] who will see patient.     MDM Rules/Calculators/A&P                          Admit.    Final Clinical Impression(s) / ED Diagnoses Final diagnoses:  Closed fracture of right hip, initial encounter [facility])    Rx / [place] Orders ED Discharge Orders    None       Carlisle Cater, PA-C 06/24/20 1826    Dorie Rank, MD 06/24/20 [phone]

## 2020-06-24 NOTE — H&P (Signed)
History and Physical    Paul Lowery UTM:[id number] DOB: 1951-07-08 DOA: 06/24/2020  PCP: Benito Mccreedy, MD (Confirm with patient/family/NH records and if not entered, this has to be entered at [facility] point of entry) Patient coming from: [place]  I have personally briefly reviewed patient's old medical records in [place]  Chief Complaint: Leg pain  HPI: Paul Lowery is a 69 y.o. 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 Fib (off Eliquis in 2020), chronic diastolic CHF (LVEF 45 to 56% 2019), hypertension hyperlipidemia, COPD Gold stage II (FEV1/FVC 59% in 2019), with LTAC resident presented with fall and right hip pain. Patient reported he tripped last night in bathroom, denied any prodromes of lightheadedness blurry vision short of breath or chest pains. ED Course: Right hip comminuted intertrochanteric fracture.  Review of Systems: As per HPI otherwise 14 point review of systems negative.    No past medical history on file.     has no history on file for tobacco use, alcohol use, and drug use.  No Known Allergies  No family history on file.   Prior to Admission medications   Not on File    Physical Exam: Vitals:   06/24/20 1624 06/24/20 1630  BP: (!) 160/72 (!) 150/87  Pulse: 68 70  Resp: 16 (!) 25  Temp: 98.8 F (37.1 C)   TempSrc: Oral   SpO2: 98% 95%    Constitutional: NAD, calm, comfortable Vitals:   06/24/20 1624 06/24/20 1630  BP: (!) 160/72 (!) 150/87  Pulse: 68 70  Resp: 16 (!) 25  Temp: 98.8 F (37.1 C)   TempSrc: Oral   SpO2: 98% 95%   Eyes: PERRL, lids and conjunctivae normal ENMT: Mucous membranes are moist. Posterior pharynx clear of any exudate or lesions.Normal dentition.  Neck: Tracheostomy in place Respiratory: clear to auscultation bilaterally, no wheezing, no crackles. Normal respiratory effort. No accessory  muscle use.  Cardiovascular: Irregular heart rate, no murmurs / rubs / gallops. 1+ extremity edema. 2+ pedal pulses. No carotid bruits.  Abdomen: no tenderness, no masses palpated. No hepatosplenomegaly. Bowel sounds positive.  Musculoskeletal: no clubbing / cyanosis. No joint deformity upper and lower extremities. Good ROM, no contractures. Right hip tenderness Skin: no rashes, lesions, ulcers. No induration Neurologic: CN 2-12 grossly intact. Sensation intact, DTR normal. Strength 5/5 in all 4.  Psychiatric: Normal judgment and insight. Alert and oriented x 3. Normal mood.     Labs on Admission: I have personally reviewed following labs and imaging studies  CBC: Recent Labs  Lab 06/24/20 1732  WBC 5.2  NEUTROABS 3.9  HGB 9.0*  HCT 29.2*  MCV 80.0  PLT 812   Basic Metabolic Panel: Recent Labs  Lab 06/24/20 1732  NA 135  K 4.9  CL 97*  CO2 30  GLUCOSE 102*  BUN 21  CREATININE 0.72  CALCIUM 9.1   GFR: CrCl cannot be calculated (Unknown ideal weight.). Liver Function Tests: No results for input(s): AST, ALT, ALKPHOS, BILITOT, PROT, ALBUMIN in the last 168 hours. No results for input(s): LIPASE, AMYLASE in the last 168 hours. No results for input(s): AMMONIA in the last 168 hours. Coagulation Profile: No results for input(s): INR, PROTIME in the last 168 hours. Cardiac Enzymes: No results for input(s): CKTOTAL, CKMB, CKMBINDEX, TROPONINI in the last 168 hours. BNP (last 3 results) No results for input(s): PROBNP in the last 8760 hours. HbA1C: No results  for input(s): HGBA1C in the last 72 hours. CBG: No results for input(s): GLUCAP in the last 168 hours. Lipid Profile: No results for input(s): CHOL, HDL, LDLCALC, TRIG, CHOLHDL, LDLDIRECT in the last 72 hours. Thyroid Function Tests: No results for input(s): TSH, T4TOTAL, FREET4, T3FREE, THYROIDAB in the last 72 hours. Anemia Panel: No results for input(s): VITAMINB12, FOLATE, FERRITIN, TIBC, IRON, RETICCTPCT in  the last 72 hours. Urine analysis: No results found for: COLORURINE, APPEARANCEUR, LABSPEC, [place], GLUCOSEU, HGBUR, BILIRUBINUR, KETONESUR, PROTEINUR, UROBILINOGEN, NITRITE, LEUKOCYTESUR  Radiological Exams on Admission: DG Chest 1 View  Result Date: 06/24/2020 CLINICAL DATA:  Fall, hip fracture EXAM: CHEST  1 VIEW COMPARISON:  Radiograph 04/15/2018 FINDINGS: Endotracheal tube terminates in the mid trachea, approximately 5.5 cm from the level of the carina. There are postsurgical changes from prior sternotomy and CABG including reinforced inferior sternal sutures. Telemetry leads overlie the chest. Cardiomegaly similar to comparison exams. Aorta is calcified. There are diffuse hazy opacities throughout the lungs with a slight basilar gradient as well as mild cephalization and indistinctness of the pulmonary vascularity. Suspect some mild septal thickening seen in the periphery. No pneumothorax or visible effusion. No focal consolidation. No acute osseous or soft tissue abnormality. Degenerative changes are present in the imaged spine and shoulders. IMPRESSION: 1. Endotracheal tube terminates in the mid trachea, approximately 5.5 cm from the level of the carina. 2. Findings could suggest CHF with mild pulmonary edema and stable cardiomegaly. 3. No visible pleural effusion, pneumothorax or focal consolidative process. 4. Prior sternotomy and CABG. 5.  Aortic Atherosclerosis (ICD10-I70.0). Electronically Signed   By: Lovena Le M.D.   On: 06/24/2020 17:01   DG Hip Unilat With Pelvis 2-3 Views Right  Result Date: 06/24/2020 CLINICAL DATA:  Positive hip fracture, mechanical fall last night. EXAM: DG HIP (WITH OR WITHOUT PELVIS) 2-3V RIGHT COMPARISON:  None. FINDINGS: Comminuted intertrochanteric fracture of the right femur. Partially visualized surgical changes of the proximal left femur. Frontal views of the left femur otherwise grossly unremarkable. No hip dislocation bilaterally. No acute fracture of  the bones of the pelvis. No diastasis. IMPRESSION: Comminuted intertrochanteric right femoral fracture. Electronically Signed   By: Iven Finn M.D.   On: 06/24/2020 17:03    EKG: Independently reviewed. Chronic A. fib, no acute ST-T changes  Assessment/Plan Active Problems:   Hip fracture (HCC)  (please populate well all problems here in Problem List. (For example, if patient is on BP meds at home and you resume or decide to hold them, it is a problem that needs to be her. Same for CAD, COPD, HLD and so on)  Right hip fracture -Secondary to mechanical fall -Patient does have extensive cardiac history with CABG x3 in 2019, no stress test since CABG. Chronic A. fib, chronic diastolic CHF. Patient rather asymptomatic denied any chest pain shortness of breath cough. However chest x-ray showed cardiomegaly and dense of pulmonary vasculature congestion suggest poorly controlled CHF. -Ordered echo to reevaluate LVEF, most recent record on [facility]'s cardiology record echo was done in 2019. -Placed consult to cardiology for preop cardiac risk stratification, as of now with preop 30 days cardiac risk 10%. Plan to talk to cardiology regarding modification of preop cardiac risk. Continue beta-blocker, change Lasix to IV. Consult cardiology for further optimization of patient's CHF medication.  Acute diastolic CHF decompensation -Lasix 80 mg IV x1, and repeat chest x-ray tomorrow resume p.o. Lasix vs further diuresis  Right hip fracture -Plan for ORIF  COPD Gold stage II -He  does have tracheal secretion, no other symptoms signs of COPD exacerbation 8 point -Continue maintenance breathing medications -Resume Mucomyst  Tonsillar CA -Discussed with patient daughter, plan for radiation therapy and follows with [facility] oncology  HTN -Resume home BP meds  DVT prophylaxis: Lovenox Code Status: Full code Family Communication: Daughter over the phone Disposition Plan: Patient has extensive medical  history with cardiac issue came with hip fracture need ORIF, expect more than 3 days hospital stay Consults called: Cardiology, orthopedic surgery Admission status: Telemetry admission   Lequita Halt MD [facility] Pager [phone]  06/24/2020, 6:45 PM

## 2020-06-24 NOTE — Consult Note (Signed)
ORTHOPAEDIC CONSULTATION  REQUESTING PHYSICIAN: Lequita Halt, MD  Chief Complaint: right hip pain  HPI: Paul Lowery is a 69 y.o. 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  Patient currently resides at [place]. He had a reported fall last night. He was taken to [facility] Department today by EMS. Xrays in emergency department showed right intertrochanteric femur fracture. Orthopedics was consulted.   Difficult to obtain history due to patient's aphonia and tracheostomy. Patient notes he is only have pain in his right hip. He notes he has surgery on his left lower extremity in the past, but difficult to get more information about this. Pain has improved with IV pain medication. Resides at [facility]. Normally ambulates with cane/walker.  No past medical history on file.  Social History   Socioeconomic History  . Marital status: Unknown    Spouse name: Not on file  . Number of children: Not on file  . Years of education: Not on file  . Highest education level: Not on file  Occupational History  . Not on file  Tobacco Use  . Smoking status: Not on file  Substance and Sexual Activity  . Alcohol use: Not on file  . Drug use: Not on file  . Sexual activity: Not on file  Other Topics Concern  . Not on file  Social History Narrative  . Not on file   Social Determinants of Health   Financial Resource Strain:   . Difficulty of Paying Living Expenses: Not on file  Food Insecurity:   . Worried About Charity fundraiser in the Last Year: Not on file  . Ran Out of Food in the Last Year: Not on file  Transportation Needs:   . Lack of Transportation (Medical): Not on file  . Lack of Transportation (Non-Medical): Not on file  Physical Activity:   . Days of Exercise per  Week: Not on file  . Minutes of Exercise per Session: Not on file  Stress:   . Feeling of Stress : Not on file  Social Connections:   . Frequency of Communication with Friends and Family: Not on file  . Frequency of Social Gatherings with Friends and Family: Not on file  . Attends Religious Services: Not on file  . Active Member of Clubs or Organizations: Not on file  . Attends Archivist Meetings: Not on file  . Marital Status: Not on file   No family history on file. No Known Allergies Prior to Admission medications   Not on File   DG Chest 1 View  Result Date: 06/24/2020 CLINICAL DATA:  Fall, hip fracture EXAM: CHEST  1 VIEW COMPARISON:  Radiograph 04/15/2018 FINDINGS: Endotracheal tube terminates in the mid trachea, approximately 5.5 cm from the level of the carina. There are postsurgical changes from prior sternotomy and CABG including reinforced inferior sternal sutures. Telemetry leads overlie the chest. Cardiomegaly similar to comparison exams. Aorta is calcified. There are diffuse hazy opacities throughout the lungs with a slight basilar gradient as well as mild cephalization and indistinctness of the pulmonary vascularity. Suspect some mild septal thickening seen in the periphery. No pneumothorax or visible effusion. No focal consolidation. No acute osseous or soft tissue abnormality. Degenerative changes are present in the imaged spine and shoulders. IMPRESSION: 1. Endotracheal tube terminates in the mid trachea, approximately 5.5  cm from the level of the carina. 2. Findings could suggest CHF with mild pulmonary edema and stable cardiomegaly. 3. No visible pleural effusion, pneumothorax or focal consolidative process. 4. Prior sternotomy and CABG. 5.  Aortic Atherosclerosis (ICD10-I70.0). Electronically Signed   By: Lovena Le M.D.   On: 06/24/2020 17:01   DG Hip Unilat With Pelvis 2-3 Views Right  Result Date: 06/24/2020 CLINICAL DATA:  Positive hip fracture, mechanical  fall last night. EXAM: DG HIP (WITH OR WITHOUT PELVIS) 2-3V RIGHT COMPARISON:  None. FINDINGS: Comminuted intertrochanteric fracture of the right femur. Partially visualized surgical changes of the proximal left femur. Frontal views of the left femur otherwise grossly unremarkable. No hip dislocation bilaterally. No acute fracture of the bones of the pelvis. No diastasis. IMPRESSION: Comminuted intertrochanteric right femoral fracture. Electronically Signed   By: Iven Finn M.D.   On: 06/24/2020 17:03   Family History Reviewed and non-contributory, no pertinent history of problems with bleeding or anesthesia      Review of Systems 14 system ROS conducted and negative except for that noted in HPI   OBJECTIVE  Vitals: Patient Vitals for the past 8 hrs:  BP Temp Temp src Pulse Resp SpO2  06/24/20 1815 (!) 146/75 -- -- 67 15 96 %  06/24/20 1715 (!) 161/81 -- -- 81 11 98 %  06/24/20 1645 (!) 146/78 -- -- 83 (!) 27 99 %  06/24/20 1630 (!) 150/87 -- -- 70 (!) 25 95 %  06/24/20 1624 (!) 160/72 98.8 F (37.1 C) Oral 68 16 98 %   General: Alert, no acute distress Cardiovascular: Warm extremities noted Respiratory: Tracheostomy in place, No cyanosis, no use of accessory musculature GI: No organomegaly, abdomen is soft and non-tender Skin: No lesions in the area of chief complaint other than those listed below in MSK exam.  Neurologic: Sensation intact distally save for the below mentioned MSK exam Psychiatric: Patient is competent for consent with normal mood and affect Lymphatic: Mild lower extremity edema  Extremities  Bilateral upper extremities: actively moves upper extremities without any pain. NVI.  RLE: Slightly shortened and externally rotated. ROM deferred. Distal motor and sensory function intact. 2+ DP pulse with warm and well perfused digits. Compartments soft and compressible, with no pain on passive stretch. LLE: nontender to palpation throughout LLE. Tolerates gentle ROM.  NVI.     Test Results Imaging Xrays right hip show comminuted intertrochanteric femur fracture. Limited viewing of previous surgical fixation on left femur.   Labs cbc Recent Labs    06/24/20 1732  WBC 5.2  HGB 9.0*  HCT 29.2*  PLT 253    Labs inflam No results for input(s): CRP in the last 72 hours.  Invalid input(s): ESR  Labs coag No results for input(s): INR, PTT in the last 72 hours.  Invalid input(s): PT  Recent Labs    06/24/20 1732  NA 135  K 4.9  CL 97*  CO2 30  GLUCOSE 102*  BUN 21  CREATININE 0.72  CALCIUM 9.1     ASSESSMENT AND PLAN: 69 y.o. male with the following: right intertrochanteric femur fracture  Orthopedics recommends admission to a medical service and we will provide consultation and follow along.  Discussed the nature of the injury as well as the care with the patient as well as the family.  Discussed options and non-operative versus operative measures. Nonoperative measures are not well tolerated as patient's on bedrest for extended periods of time tend to develop secondary issues such as  pneumonia, urinary tract infections, bedsores and delirium.  Based on this our recommendation is for operative measures.  Understanding this the patient/family elected to proceed with operative measures.  The risks and benefits of  surgical intervention including infection, bleeding, nerve injury, periprosthetic fracture, the need for revision surgery, leg length discrepancy, gait change, blood clots, cardiopulmonary complications, morbidity, mortality, among others, and they were willing to proceed.    - Plan: Operative fixation tomorrow if cleared by hospitalist team - NPO at midnight -Medicine team to admit and perform pre-op clearance - Weight Bearing Status/Activity: will ammend WB status postop, bedrest for now - PT/OT post op - VTE Prophylaxis: SCDs for now - Pain control: PRN pain medications - Dispo: Likely to require Rehab or SNF placement  upon discharge.  - Contact information: Dr. Ophelia Charter, Noemi Chapel, PA-C   [place], [place] 06/24/2020 8:26 PM

## 2020-06-25 ENCOUNTER — Inpatient Hospital Stay (HOSPITAL_COMMUNITY): Payer: Medicare Other

## 2020-06-25 ENCOUNTER — Inpatient Hospital Stay (HOSPITAL_COMMUNITY): Payer: Medicare Other | Admitting: Certified Registered"

## 2020-06-25 ENCOUNTER — Encounter (HOSPITAL_COMMUNITY)
Admission: EM | Disposition: A | Discharge status: Nursing Home (Includes ICF) | Payer: Self-pay | Source: Home / Self Care | Attending: Internal Medicine

## 2020-06-25 DIAGNOSIS — S72091A Other fracture of head and neck of right femur, initial encounter for closed fracture: Secondary | ICD-10-CM

## 2020-06-25 DIAGNOSIS — I361 Nonrheumatic tricuspid (valve) insufficiency: Secondary | ICD-10-CM

## 2020-06-25 DIAGNOSIS — I10 Essential (primary) hypertension: Secondary | ICD-10-CM | POA: Diagnosis present

## 2020-06-25 DIAGNOSIS — E440 Moderate protein-calorie malnutrition: Secondary | ICD-10-CM | POA: Insufficient documentation

## 2020-06-25 DIAGNOSIS — I340 Nonrheumatic mitral (valve) insufficiency: Secondary | ICD-10-CM

## 2020-06-25 DIAGNOSIS — Z01810 Encounter for preprocedural cardiovascular examination: Secondary | ICD-10-CM

## 2020-06-25 DIAGNOSIS — E785 Hyperlipidemia, unspecified: Secondary | ICD-10-CM | POA: Diagnosis present

## 2020-06-25 DIAGNOSIS — I482 Chronic atrial fibrillation, unspecified: Secondary | ICD-10-CM

## 2020-06-25 DIAGNOSIS — Z930 Tracheostomy status: Secondary | ICD-10-CM

## 2020-06-25 DIAGNOSIS — J449 Chronic obstructive pulmonary disease, unspecified: Secondary | ICD-10-CM | POA: Diagnosis present

## 2020-06-25 DIAGNOSIS — C091 Malignant neoplasm of tonsillar pillar (anterior) (posterior): Secondary | ICD-10-CM | POA: Diagnosis present

## 2020-06-25 DIAGNOSIS — I5032 Chronic diastolic (congestive) heart failure: Secondary | ICD-10-CM

## 2020-06-25 HISTORY — PX: INTRAMEDULLARY (IM) NAIL INTERTROCHANTERIC: SHX5875

## 2020-06-25 LAB — BASIC METABOLIC PANEL
Anion gap: 4 — ABNORMAL LOW (ref 5–15)
BUN: 16 mg/dL (ref 8–23)
CO2: 30 mmol/L (ref 22–32)
Calcium: 9.1 mg/dL (ref 8.9–10.3)
Chloride: 99 mmol/L (ref 98–111)
Creatinine, Ser: 0.7 mg/dL (ref 0.61–1.24)
GFR calc Af Amer: >60 mL/min (ref >60)
GFR calc non Af Amer: >60 mL/min (ref >60)
Glucose, Bld: 102 mg/dL — ABNORMAL HIGH (ref 70–99)
Potassium: 3.4 mmol/L — ABNORMAL LOW (ref 3.5–5.1)
Sodium: 133 mmol/L — ABNORMAL LOW (ref 135–145)

## 2020-06-25 LAB — CBC
HCT: 28.6 % — ABNORMAL LOW (ref 39.0–52.0)
Hemoglobin: 8.7 g/dL — ABNORMAL LOW (ref 13.0–17.0)
MCH: 24.2 pg — ABNORMAL LOW (ref 26.0–34.0)
MCHC: 30.4 g/dL (ref 30.0–36.0)
MCV: 79.4 fL — ABNORMAL LOW (ref 80.0–100.0)
Platelets: 185 10*3/uL (ref 150–400)
RBC: 3.6 MIL/uL — ABNORMAL LOW (ref 4.22–5.81)
RDW: 16.6 % — ABNORMAL HIGH (ref 11.5–15.5)
WBC: 4.8 10*3/uL (ref 4.0–10.5)
nRBC: 0 % (ref 0.0–0.2)

## 2020-06-25 LAB — SURGICAL PCR SCREEN
MRSA, PCR: NEGATIVE
Staphylococcus aureus: NEGATIVE

## 2020-06-25 LAB — ECHOCARDIOGRAM COMPLETE
Area-P 1/2: 3.08 cm2
S' Lateral: 3.8 cm

## 2020-06-25 LAB — GLUCOSE, CAPILLARY: Glucose-Capillary: 119 mg/dL — ABNORMAL HIGH (ref 70–99)

## 2020-06-25 SURGERY — FIXATION, FRACTURE, INTERTROCHANTERIC, WITH INTRAMEDULLARY ROD
Anesthesia: General | Laterality: Right

## 2020-06-25 MED ORDER — LABETALOL HCL 5 MG/ML IV SOLN
10.0000 mg | Freq: Once | INTRAVENOUS | Status: AC
  Administered 2020-06-25: 10 mg via INTRAVENOUS

## 2020-06-25 MED ORDER — PROPOFOL 10 MG/ML IV BOLUS
INTRAVENOUS | Status: DC | PRN
  Administered 2020-06-25: 50 mg via INTRAVENOUS

## 2020-06-25 MED ORDER — PROSOURCE TF PO LIQD
90.0000 mL | Freq: Two times a day (BID) | ORAL | Status: DC
  Administered 2020-06-26 – 2020-06-28 (×5): 90 mL
  Filled 2020-06-25 (×6): qty 90

## 2020-06-25 MED ORDER — ORAL CARE MOUTH RINSE: 15.0000 mL | Freq: Once | OROMUCOSAL | Status: AC

## 2020-06-25 MED ORDER — POLYETHYLENE GLYCOL 3350 17 G PO PACK: 17.0000 g | PACK | Freq: Every day | ORAL | Status: DC | PRN

## 2020-06-25 MED ORDER — LABETALOL HCL 5 MG/ML IV SOLN
INTRAVENOUS | Status: DC | PRN
  Administered 2020-06-25 (×2): 5 mg via INTRAVENOUS

## 2020-06-25 MED ORDER — FENTANYL CITRATE (PF) 100 MCG/2ML IJ SOLN
INTRAMUSCULAR | Status: DC | PRN
Start: 2020-06-25 — End: 2020-06-25
  Administered 2020-06-25 (×5): 50 ug via INTRAVENOUS

## 2020-06-25 MED ORDER — FREE WATER
175.0000 mL | Status: DC
  Administered 2020-06-26 – 2020-06-28 (×16): 175 mL

## 2020-06-25 MED ORDER — FENTANYL CITRATE (PF) 250 MCG/5ML IJ SOLN
INTRAMUSCULAR | Status: AC
  Filled 2020-06-25: qty 5

## 2020-06-25 MED ORDER — DOCUSATE SODIUM 100 MG PO CAPS: 100.0000 mg | ORAL_CAPSULE | Freq: Two times a day (BID) | ORAL | Status: DC

## 2020-06-25 MED ORDER — FENTANYL CITRATE (PF) 100 MCG/2ML IJ SOLN
25.0000 ug | INTRAMUSCULAR | Status: DC | PRN
  Administered 2020-06-25 (×2): 50 ug via INTRAVENOUS

## 2020-06-25 MED ORDER — 0.9 % SODIUM CHLORIDE (POUR BTL) OPTIME
TOPICAL | Status: DC | PRN
  Administered 2020-06-25: 1000 mL

## 2020-06-25 MED ORDER — PHENOL 1.4 % MT LIQD: 1.0000 | OROMUCOSAL | Status: DC | PRN

## 2020-06-25 MED ORDER — VANCOMYCIN HCL 1000 MG IV SOLR
INTRAVENOUS | Status: AC
  Filled 2020-06-25: qty 1000

## 2020-06-25 MED ORDER — DOCUSATE SODIUM 50 MG/5ML PO LIQD
100.0000 mg | Freq: Two times a day (BID) | ORAL | Status: DC
  Administered 2020-06-25 – 2020-06-28 (×6): 100 mg
  Filled 2020-06-25 (×6): qty 10

## 2020-06-25 MED ORDER — OXYCODONE HCL 5 MG PO TABS
10.0000 mg | ORAL_TABLET | ORAL | Status: DC | PRN
  Administered 2020-06-26 – 2020-06-28 (×6): 10 mg via ORAL
  Filled 2020-06-25 (×6): qty 2

## 2020-06-25 MED ORDER — OXYCODONE HCL 5 MG/5ML PO SOLN: 5.0000 mg | Freq: Once | ORAL | Status: DC | PRN

## 2020-06-25 MED ORDER — VANCOMYCIN HCL 1000 MG IV SOLR
INTRAVENOUS | Status: DC | PRN
  Administered 2020-06-25: 1000 mg

## 2020-06-25 MED ORDER — PROPOFOL 10 MG/ML IV BOLUS
INTRAVENOUS | Status: AC
  Filled 2020-06-25: qty 20

## 2020-06-25 MED ORDER — ONDANSETRON HCL 4 MG PO TABS: 4.0000 mg | ORAL_TABLET | Freq: Four times a day (QID) | ORAL | Status: DC | PRN

## 2020-06-25 MED ORDER — ACETAMINOPHEN 500 MG PO TABS: 1000.0000 mg | ORAL_TABLET | Freq: Once | ORAL | Status: DC

## 2020-06-25 MED ORDER — MENTHOL 3 MG MT LOZG: 1.0000 | LOZENGE | OROMUCOSAL | Status: DC | PRN

## 2020-06-25 MED ORDER — SUGAMMADEX SODIUM 200 MG/2ML IV SOLN
INTRAVENOUS | Status: DC | PRN
  Administered 2020-06-25: 200 mg via INTRAVENOUS

## 2020-06-25 MED ORDER — ONDANSETRON HCL 4 MG/2ML IJ SOLN
INTRAMUSCULAR | Status: AC
  Filled 2020-06-25: qty 2

## 2020-06-25 MED ORDER — HYDROMORPHONE HCL 1 MG/ML IJ SOLN
0.5000 mg | INTRAMUSCULAR | Status: DC | PRN
  Administered 2020-06-26 – 2020-06-28 (×5): 0.5 mg via INTRAVENOUS
  Filled 2020-06-25 (×6): qty 0.5

## 2020-06-25 MED ORDER — OSMOLITE 1.5 CAL PO LIQD
1000.0000 mL | ORAL | Status: DC
  Administered 2020-06-26: 1000 mL
  Filled 2020-06-25 (×4): qty 1000

## 2020-06-25 MED ORDER — ACETAMINOPHEN 10 MG/ML IV SOLN
INTRAVENOUS | Status: AC
  Filled 2020-06-25: qty 100

## 2020-06-25 MED ORDER — ACETAMINOPHEN 10 MG/ML IV SOLN
1000.0000 mg | Freq: Once | INTRAVENOUS | Status: DC | PRN
  Administered 2020-06-25: 1000 mg via INTRAVENOUS

## 2020-06-25 MED ORDER — HYDROMORPHONE HCL 1 MG/ML IJ SOLN
INTRAMUSCULAR | Status: AC
  Filled 2020-06-25: qty 1

## 2020-06-25 MED ORDER — LACTATED RINGERS IV SOLN: INTRAVENOUS | Status: DC

## 2020-06-25 MED ORDER — CELECOXIB 100 MG PO CAPS
100.0000 mg | ORAL_CAPSULE | Freq: Two times a day (BID) | ORAL | Status: DC
  Administered 2020-06-25 – 2020-06-28 (×7): 100 mg via ORAL
  Filled 2020-06-25 (×7): qty 1

## 2020-06-25 MED ORDER — CHLORHEXIDINE GLUCONATE 0.12 % MT SOLN
OROMUCOSAL | Status: AC
  Administered 2020-06-25: 15 mL via OROMUCOSAL
  Filled 2020-06-25: qty 15

## 2020-06-25 MED ORDER — CHLORHEXIDINE GLUCONATE 0.12 % MT SOLN: 15.0000 mL | Freq: Once | OROMUCOSAL | Status: AC

## 2020-06-25 MED ORDER — LABETALOL HCL 5 MG/ML IV SOLN
INTRAVENOUS | Status: AC
  Filled 2020-06-25: qty 4

## 2020-06-25 MED ORDER — ACETAMINOPHEN 500 MG PO TABS
1000.0000 mg | ORAL_TABLET | Freq: Three times a day (TID) | ORAL | Status: AC
  Administered 2020-06-26 (×3): 1000 mg via ORAL
  Filled 2020-06-25 (×4): qty 2

## 2020-06-25 MED ORDER — OXYCODONE HCL 5 MG PO TABS: 5.0000 mg | ORAL_TABLET | ORAL | Status: DC | PRN

## 2020-06-25 MED ORDER — BISACODYL 5 MG PO TBEC: 5.0000 mg | DELAYED_RELEASE_TABLET | Freq: Every day | ORAL | Status: DC | PRN

## 2020-06-25 MED ORDER — LACTATED RINGERS IV SOLN: INTRAVENOUS | Status: DC | PRN

## 2020-06-25 MED ORDER — ENOXAPARIN SODIUM 40 MG/0.4ML ~~LOC~~ SOLN
40.0000 mg | SUBCUTANEOUS | Status: DC
  Administered 2020-06-26 – 2020-06-28 (×3): 40 mg via SUBCUTANEOUS
  Filled 2020-06-25 (×3): qty 0.4

## 2020-06-25 MED ORDER — FENTANYL CITRATE (PF) 100 MCG/2ML IJ SOLN
INTRAMUSCULAR | Status: AC
  Filled 2020-06-25: qty 2

## 2020-06-25 MED ORDER — ALBUTEROL SULFATE (2.5 MG/3ML) 0.083% IN NEBU
2.5000 mg | INHALATION_SOLUTION | RESPIRATORY_TRACT | Status: DC | PRN
  Administered 2020-06-28: 2.5 mg via RESPIRATORY_TRACT
  Filled 2020-06-25: qty 3

## 2020-06-25 MED ORDER — ACETYLCYSTEINE 20 % IN SOLN
4.0000 mL | RESPIRATORY_TRACT | Status: DC | PRN
  Filled 2020-06-25: qty 4

## 2020-06-25 MED ORDER — METOCLOPRAMIDE HCL 5 MG/ML IJ SOLN: 5.0000 mg | Freq: Three times a day (TID) | INTRAMUSCULAR | Status: DC | PRN

## 2020-06-25 MED ORDER — IPRATROPIUM-ALBUTEROL 0.5-2.5 (3) MG/3ML IN SOLN
3.0000 mL | Freq: Two times a day (BID) | RESPIRATORY_TRACT | Status: DC
  Administered 2020-06-26 – 2020-06-28 (×5): 3 mL via RESPIRATORY_TRACT
  Filled 2020-06-25 (×5): qty 3

## 2020-06-25 MED ORDER — BUDESONIDE 0.25 MG/2ML IN SUSP
0.2500 mg | Freq: Two times a day (BID) | RESPIRATORY_TRACT | Status: DC
  Administered 2020-06-26 – 2020-06-28 (×5): 0.25 mg via RESPIRATORY_TRACT
  Filled 2020-06-25 (×5): qty 2

## 2020-06-25 MED ORDER — OXYCODONE HCL 5 MG PO TABS: 5.0000 mg | ORAL_TABLET | Freq: Once | ORAL | Status: DC | PRN

## 2020-06-25 MED ORDER — HYDROMORPHONE HCL 1 MG/ML IJ SOLN
0.2500 mg | INTRAMUSCULAR | Status: DC | PRN
  Administered 2020-06-25 (×2): 0.5 mg via INTRAVENOUS

## 2020-06-25 MED ORDER — OSMOLITE 1.2 CAL PO LIQD: 1000.0000 mL | ORAL | Status: DC

## 2020-06-25 MED ORDER — ONDANSETRON HCL 4 MG/2ML IJ SOLN: 4.0000 mg | Freq: Once | INTRAMUSCULAR | Status: DC | PRN

## 2020-06-25 MED ORDER — ONDANSETRON HCL 4 MG/2ML IJ SOLN: 4.0000 mg | Freq: Four times a day (QID) | INTRAMUSCULAR | Status: DC | PRN

## 2020-06-25 MED ORDER — ROCURONIUM BROMIDE 100 MG/10ML IV SOLN
INTRAVENOUS | Status: DC | PRN
  Administered 2020-06-25: 50 mg via INTRAVENOUS

## 2020-06-25 MED ORDER — METOCLOPRAMIDE HCL 5 MG PO TABS: 5.0000 mg | ORAL_TABLET | Freq: Three times a day (TID) | ORAL | Status: DC | PRN

## 2020-06-25 MED ORDER — CEFAZOLIN SODIUM-DEXTROSE 2-4 GM/100ML-% IV SOLN
2.0000 g | Freq: Four times a day (QID) | INTRAVENOUS | Status: AC
  Administered 2020-06-25 – 2020-06-26 (×2): 2 g via INTRAVENOUS
  Filled 2020-06-25 (×2): qty 100

## 2020-06-25 SURGICAL SUPPLY — 42 items
BIT DRILL 4.0X280 (BIT) ×2 IMPLANT
BNDG COHESIVE 4X5 TAN STRL (GAUZE/BANDAGES/DRESSINGS) ×3 IMPLANT
BNDG GAUZE ELAST 4 BULKY (GAUZE/BANDAGES/DRESSINGS) ×3 IMPLANT
CLOSURE STERI-STRIP 1/2X4 (GAUZE/BANDAGES/DRESSINGS) ×1
CLSR STERI-STRIP ANTIMIC 1/2X4 (GAUZE/BANDAGES/DRESSINGS) ×2 IMPLANT
COVER PERINEAL POST (MISCELLANEOUS) ×3 IMPLANT
COVER SURGICAL LIGHT HANDLE (MISCELLANEOUS) ×3 IMPLANT
COVER WAND RF STERILE (DRAPES) ×3 IMPLANT
DRAPE STERI IOBAN 125X83 (DRAPES) ×3 IMPLANT
DRSG AQUACEL AG ADV 3.5X 4 (GAUZE/BANDAGES/DRESSINGS) ×2 IMPLANT
DRSG AQUACEL AG ADV 3.5X 6 (GAUZE/BANDAGES/DRESSINGS) ×3 IMPLANT
DURAPREP 26ML APPLICATOR (WOUND CARE) ×3 IMPLANT
ELECT REM PT RETURN 9FT ADLT (ELECTROSURGICAL) ×3
ELECTRODE REM PT RTRN 9FT ADLT (ELECTROSURGICAL) ×1 IMPLANT
GLOVE BIO SURGEON STRL SZ 6.5 (GLOVE) ×2 IMPLANT
GLOVE BIO SURGEONS STRL SZ 6.5 (GLOVE) ×1
GLOVE BIOGEL PI IND STRL 8 (GLOVE) ×1 IMPLANT
GLOVE BIOGEL PI INDICATOR 8 (GLOVE) ×2
GLOVE ECLIPSE 8.0 STRL XLNG CF (GLOVE) ×6 IMPLANT
GLOVE INDICATOR 6.5 STRL GRN (GLOVE) ×3 IMPLANT
GOWN STRL REUS W/ TWL LRG LVL3 (GOWN DISPOSABLE) ×2 IMPLANT
GOWN STRL REUS W/TWL 2XL LVL3 (GOWN DISPOSABLE) IMPLANT
GOWN STRL REUS W/TWL LRG LVL3 (GOWN DISPOSABLE) ×4
GUIDE PIN 3.2X330 (PIN) ×6
GUIDEWIRE BALL NOSE 3.0X900 (WIRE) ×2 IMPLANT
KIT TURNOVER KIT B (KITS) ×3 IMPLANT
MANIFOLD NEPTUNE II (INSTRUMENTS) ×3 IMPLANT
NAIL FEM TROCH IM 10X130 (Nail) ×2 IMPLANT
NS IRRIG 1000ML POUR BTL (IV SOLUTION) ×3 IMPLANT
PACK GENERAL/GYN (CUSTOM PROCEDURE TRAY) ×3 IMPLANT
PAD ARMBOARD 7.5X6 YLW CONV (MISCELLANEOUS) ×3 IMPLANT
PIN GUIDE 3.2X330 (PIN) IMPLANT
SCREW LAG 10.5X110 (Screw) ×2 IMPLANT
SCREW LOCK CORT 5.0X40 (Screw) ×2 IMPLANT
SUT MNCRL AB 4-0 PS2 18 (SUTURE) ×3 IMPLANT
SUT VIC AB 0 CT1 27 (SUTURE) ×2
SUT VIC AB 0 CT1 27XBRD ANBCTR (SUTURE) ×1 IMPLANT
SUT VIC AB 2-0 CT1 27 (SUTURE) ×2
SUT VIC AB 2-0 CT1 TAPERPNT 27 (SUTURE) ×1 IMPLANT
TOOL ACTIVATION (INSTRUMENTS) ×2 IMPLANT
TOWEL GREEN STERILE (TOWEL DISPOSABLE) ×3 IMPLANT
TOWEL GREEN STERILE FF (TOWEL DISPOSABLE) ×3 IMPLANT

## 2020-06-25 NOTE — Progress Notes (Signed)
Trach check not done at this time due to patient being off unit in OR. Will check back later

## 2020-06-25 NOTE — Progress Notes (Signed)
  Echocardiogram 2D Echocardiogram has been performed.  Paul Lowery 06/25/2020, 8:38 AM

## 2020-06-25 NOTE — Anesthesia Preprocedure Evaluation (Addendum)
Anesthesia Evaluation  Patient identified by MRN, date of birth, ID band Patient awake    Reviewed: Allergy & Precautions, NPO status , Patient's Chart, lab work & pertinent test results, reviewed documented beta blocker date and time   Airway Mallampati: Trach       Dental  (+) Dental Advisory Given   Pulmonary COPD,    breath sounds clear to auscultation       Cardiovascular hypertension, Pt. on home beta blockers and Pt. on medications + Cardiac Stents and +CHF   Rhythm:Regular Rate:Normal  Echo 06/25/2020 1. Left ventricular ejection fraction, by estimation, is 55 to 60%. The left ventricle has normal function. The left ventricle has no regional wall motion abnormalities. There is moderate concentric left ventricular hypertrophy. Left ventricular diastolic function could not be evaluated.  2. Right ventricular systolic function is normal. The right ventricular size is mildly enlarged. There is normal pulmonary artery systolic pressure. The estimated right ventricular systolic pressure is 09.3 mmHg.  3. Left atrial size was severely dilated.  4. Right atrial size was moderately dilated.  5. The mitral valve is grossly normal. Mild to moderate mitral valve regurgitation. No evidence of mitral stenosis.  6. The tricuspid valve is abnormal. Tricuspid valve regurgitation is moderate.  7. The aortic valve is tricuspid. There is mild calcification of the aortic valve. Aortic valve regurgitation is not visualized. Mild aortic valve sclerosis is present, with no evidence of aortic valve stenosis.  8. The inferior vena cava is normal in size with <50% respiratory variability, suggesting right atrial pressure of 8 mmHg.    Neuro/Psych negative neurological ROS  negative psych ROS   GI/Hepatic negative GI ROS, Neg liver ROS,   Endo/Other  negative endocrine ROS  Renal/GU negative Renal ROS     Musculoskeletal negative  musculoskeletal ROS (+)   Abdominal   Peds  Hematology negative hematology ROS (+)   Anesthesia Other Findings   Reproductive/Obstetrics                           Anesthesia Physical Anesthesia Plan  ASA: III  Anesthesia Plan: General   Post-op Pain Management:    Induction: Intravenous  PONV Risk Score and Plan: 3 and Ondansetron, Dexamethasone and Treatment may vary due to age or medical condition  Airway Management Planned: Oral ETT  Additional Equipment: None  Intra-op Plan:   Post-operative Plan: Extubation in OR  Informed Consent: I have reviewed the patients History and Physical, chart, labs and discussed the procedure including the risks, benefits and alternatives for the proposed anesthesia with the patient or authorized representative who has indicated his/her understanding and acceptance.     Dental advisory given  Plan Discussed with: CRNA  Anesthesia Plan Comments:         Anesthesia Quick Evaluation

## 2020-06-25 NOTE — Plan of Care (Signed)

## 2020-06-25 NOTE — Plan of Care (Signed)
  Problem: Safety: Goal: Ability to remain free from injury will improve Outcome: Progressing   

## 2020-06-25 NOTE — Transfer of Care (Signed)
Immediate Anesthesia Transfer of Care Note  Patient: Paul Lowery  Procedure(s) Performed: INTRAMEDULLARY (IM) NAIL INTERTROCHANTRIC (Right )  Patient Location: PACU  Anesthesia Type:General  Level of Consciousness: awake, alert , oriented and patient cooperative  Airway & Oxygen Therapy: Patient connected to tracheostomy mask oxygen  Post-op Assessment: Report given to RN and Post -op Vital signs reviewed and stable  Post vital signs: Reviewed and stable  Last Vitals:  Vitals Value Taken Time  BP 172/96 06/25/20 1815  Temp 36.7 C 06/25/20 1815  Pulse 79 06/25/20 1822  Resp 16 06/25/20 1822  SpO2 98 % 06/25/20 1822  Vitals shown include unvalidated device data.  Last Pain:  Vitals:   06/25/20 1815  TempSrc:   PainSc: 7          Complications: No complications documented.   Cuff deflated prior to leaving OR.  Awake and alert.  Tolerated well.  RR even and unlabored with no evidence distress.

## 2020-06-25 NOTE — Progress Notes (Addendum)
Initial Nutrition Assessment  DOCUMENTATION CODES:   Non-severe (moderate) malnutrition in context of chronic illness  INTERVENTION:   Initiate Osmolite 1.5 @ 55 ml/hr via g-tube.   90 ml Prosource TF BID.     175 ml free water flush every 4 hours  Tube feeding regimen provides 2140 kcal (97% of needs), 127 grams of protein, and 1006 ml of H2O. Total free water: 2056 ml daily  NUTRITION DIAGNOSIS:   Moderate Malnutrition related to chronic illness (tonsillar cancer s/p trach) as evidenced by mild fat depletion, moderate fat depletion, mild muscle depletion, moderate muscle depletion.  GOAL:   Patient will meet greater than or equal to 90% of their needs  MONITOR:   Labs, Weight trends, TF tolerance, Skin, I & O's  REASON FOR ASSESSMENT:   Consult Assessment of nutrition requirement/status, Enteral/tube feeding initiation and management  ASSESSMENT:   Paul Lowery is a 69 y.o. 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 Patient reported he tripped last night in bathroom, denied any prodromes of lightheadedness blurry vision short of breath or chest pains.  Pt admitted with rt hip fracture.   Reviewed I/O's: -1.2 L x 24 hours  UOP: 1.2 L x 24 hours  Per orthopedics notes, plan for IMN today.   Spoke with pt at bedside; he communicated mostly with head nods and close ended questions. Pt with trach.   Pt confirms that he does not eat by mouth and receives 100% of his nutrition via PEG. He is unsure of formula PTA.   Per pt, he is unsure if he has lost weight.   Unsure of TF regimen PTA. Per [facility] records from [facility], pt was on 300 ml Nutren 1.5 QID  with 100 ml free water flush every 4 hours, which provides 1800 kcals, 72 grams protein and 1536 ml free water daily, meeting 82% of estimated kcal needs and 63% of estimated protein needs.   Medications reviewed and include lasix.   Labs reviewed: Na: 133, K: 3.4.   NUTRITION - FOCUSED PHYSICAL EXAM:    Most Recent Value  Orbital Region Mild depletion  Upper Arm Region Mild depletion  Thoracic and Lumbar Region No depletion  Buccal Region No depletion  Temple Region Mild depletion  Clavicle Bone Region Mild depletion  Clavicle and Acromion Bone Region Mild depletion  Scapular Bone Region Mild depletion  Dorsal Hand Mild depletion  Patellar Region Moderate depletion  Anterior Thigh Region Moderate depletion  Posterior Calf Region Moderate depletion  Edema (RD Assessment) None  Hair Reviewed  Eyes Reviewed  Mouth Reviewed  Skin Reviewed  Nails Reviewed       Diet Order:   Diet Order            Diet NPO time specified  Diet effective midnight                 EDUCATION NEEDS:   No education needs have been identified at this time  Skin:  Skin Assessment: Reviewed RN Assessment  Last BM:  06/24/20  Height:   Ht Readings from Last 1 Encounters:  06/25/20 6\' 3"  (1.905 m)    Weight:   Wt Readings from Last 1 Encounters:  06/25/20  106.1 kg    Ideal Body Weight:  89.1 kg  BMI:  Body mass index is 29.25 kg/m.  Estimated Nutritional Needs:   Kcal:  2200-2400  Protein:  115-130 grams  Fluid:  > 2 L    Loistine Chance, RD, LDN, [place] Registered Dietitian II Certified Diabetes Care and Education Specialist Please refer to [facility] for RD and/or RD on-call/weekend/after hours pager

## 2020-06-25 NOTE — Progress Notes (Signed)
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  Admitted to the hospitalist service.  Echocardiogram ordered which noted preserved ejection fraction and unable to comment on diastolic dysfunction due to atrial fibrillation.  Cardiology consulted who recommended continuing beta-blockers through the perioperative period.  Orthopedics saw patient and plan to take him to the OR today, now that he is cleared.  This morning, patient is doing okay, mild pain, but otherwise well controlled.  Eager for surgery.  Assessment/Plan: Principal Problem:   Closed comminuted fracture of hip, right, initial encounter [facility]): Stable from cardiac standpoint.  Should be okay to go to operating room.  Monitor for blood loss anemia. Active Problems:   Chronic diastolic CHF (congestive heart failure) ([place]): Echocardiogram looks good.  Minimally elevated BNP on admission and got 1 dose of Lasix.    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   COPD (chronic obstructive pulmonary disease) ([place]): Stable during his hospitalization.    Essential hypertension: Slightly elevated blood pressure this morning, in the context of pain.   Atrial fibrillation, chronic (HCC)   Hyperlipidemia   Code Status: Full code  Family Communication: Left message for family  Disposition Plan: Post surgery, will likely need to return  back to LTAC versus skilled nursing   Consultants:  Orthopedics  Cardiology  Procedures:  Echocardiogram done 9/17: Preserved ejection fraction, unable to comment on diastolic dysfunction, no evidence of valvular dysfunction  Antimicrobials:  Preop Ancef  DVT prophylaxis: SCDs   Objective: Vitals:   06/25/20 1128 06/25/20 1249  BP:    Pulse: 82 89  Resp: 18 16  Temp:    SpO2: 95% 96%    Intake/Output Summary (Last 24 hours) at 06/25/2020 1348 Last data filed at 06/25/2020 1151 Gross per 24 hour  Intake --  Output 1350 ml  Net -1350 ml   There were no vitals filed for this visit. There is no height or weight on file to calculate BMI.  Exam:   General: Alert and oriented x3, no acute distress  HEENT: Normocephalic, status post tracheostomy  Cardiovascular: Irregular rhythm, rate controlled  Respiratory: Clear to auscultation bilaterally  Abdomen: Soft, nontender, nondistended, positive bowel sounds  Musculoskeletal: No clubbing or cyanosis, trace pitting edema  Skin: No skin breaks, tears or lesions  Neuro: No focal deficits  Psychiatry: Appropriate, no evidence of psychoses   Data Reviewed: CBC: Recent Labs  Lab 06/24/20 1732 06/25/20 0608  WBC 5.2 4.8  NEUTROABS 3.9  --   HGB 9.0* 8.7*  HCT 29.2* 28.6*  MCV 80.0 79.4*  PLT 253 106   Basic Metabolic Panel: Recent Labs  Lab 06/24/20 1732 06/25/20 0608  NA 135 133*  K 4.9 3.4*  CL 97* 99  CO2 30 30  GLUCOSE 102* 102*  BUN 21 16  CREATININE 0.72 0.70  CALCIUM 9.1 9.1   GFR: CrCl cannot be calculated (Unknown ideal weight.). Liver Function Tests: No results for input(s): AST, ALT, ALKPHOS,  BILITOT, PROT, ALBUMIN in the last 168 hours. No results for input(s): LIPASE, AMYLASE in the last 168 hours. No results for input(s): AMMONIA in the last 168 hours. Coagulation Profile: No results for input(s): INR, PROTIME in the last 168 hours. Cardiac Enzymes: No results for input(s):  CKTOTAL, CKMB, CKMBINDEX, TROPONINI in the last 168 hours. BNP (last 3 results) No results for input(s): PROBNP in the last 8760 hours. HbA1C: No results for input(s): HGBA1C in the last 72 hours. CBG: No results for input(s): GLUCAP in the last 168 hours. Lipid Profile: No results for input(s): CHOL, HDL, LDLCALC, TRIG, CHOLHDL, LDLDIRECT in the last 72 hours. Thyroid Function Tests: No results for input(s): TSH, T4TOTAL, FREET4, T3FREE, THYROIDAB in the last 72 hours. Anemia Panel: No results for input(s): VITAMINB12, FOLATE, FERRITIN, TIBC, IRON, RETICCTPCT in the last 72 hours. Urine analysis: No results found for: COLORURINE, APPEARANCEUR, LABSPEC, PHURINE, GLUCOSEU, HGBUR, BILIRUBINUR, KETONESUR, PROTEINUR, UROBILINOGEN, NITRITE, LEUKOCYTESUR Sepsis Labs: @LABRCNTIP (procalcitonin:4,lacticidven:4)  ) Recent Results (from the past 240 hour(s))  SARS Coronavirus 2 by RT PCR (hospital order, performed in [facility] hospital lab) Nasopharyngeal Nasopharyngeal Swab     Status: None   Collection Time: 06/24/20  5:19 PM   Specimen: Nasopharyngeal Swab  Result Value Ref Range Status   SARS Coronavirus 2 NEGATIVE NEGATIVE Final    Comment: (NOTE) SARS-CoV-2 target nucleic acids are NOT DETECTED.  The SARS-CoV-2 RNA is generally detectable in upper and lower respiratory specimens during the acute phase of infection. The lowest concentration of SARS-CoV-2 viral copies this assay can detect is 250 copies / mL. A negative result does not preclude SARS-CoV-2 infection and should not be used as the sole basis for treatment or other patient management decisions.  A negative result may occur with improper specimen collection / handling, submission of specimen other than nasopharyngeal swab, presence of viral mutation(s) within the areas targeted by this assay, and inadequate number of viral copies (<250 copies / mL). A negative result must be combined with clinical observations, patient  history, and epidemiological information.  Fact Sheet for Patients:   StrictlyIdeas.no  Fact Sheet for Healthcare Providers: BankingDealers.co.za  This test is not yet approved or  cleared by the [place] FDA and has been authorized for detection and/or diagnosis of SARS-CoV-2 by FDA under an Emergency Use Authorization (EUA).  This EUA will remain in effect (meaning this test can be used) for the duration of the COVID-19 declaration under Section 564(b)(1) of the Act, 21 U.S.C. section 360bbb-3(b)(1), unless the authorization is terminated or revoked sooner.  Performed at [facility], [place] [address], [place], [place] [address]   Surgical pcr screen     Status: None   Collection Time: 06/25/20 12:37 AM   Specimen: Nasal Mucosa; Nasal Swab  Result Value Ref Range Status   MRSA, PCR NEGATIVE NEGATIVE Final   Staphylococcus aureus NEGATIVE NEGATIVE Final    Comment: (NOTE) The Xpert SA Assay (FDA approved for NASAL specimens in patients 39 years of age and older), is one component of a comprehensive surveillance program. It is not intended to diagnose infection nor to guide or monitor treatment. Performed at [facility], [place] [address], [place], [place] [address]       Studies: DG Chest 1 View  Result Date: 06/24/2020 CLINICAL DATA:  Fall, hip fracture EXAM: CHEST  1 VIEW COMPARISON:  Radiograph 04/15/2018 FINDINGS: Endotracheal tube terminates in the mid trachea, approximately 5.5 cm from the level of the carina.  There are postsurgical changes from prior sternotomy and CABG including reinforced inferior sternal sutures. Telemetry leads overlie the chest. Cardiomegaly similar to comparison exams. Aorta is calcified. There are diffuse hazy opacities throughout the lungs with a slight basilar gradient as well as mild cephalization and indistinctness of the pulmonary vascularity. Suspect some mild septal thickening seen  in the periphery. No pneumothorax or visible effusion. No focal consolidation. No acute osseous or soft tissue abnormality. Degenerative changes are present in the imaged spine and shoulders. IMPRESSION: 1. Endotracheal tube terminates in the mid trachea, approximately 5.5 cm from the level of the carina. 2. Findings could suggest CHF with mild pulmonary edema and stable cardiomegaly. 3. No visible pleural effusion, pneumothorax or focal consolidative process. 4. Prior sternotomy and CABG. 5.  Aortic Atherosclerosis (ICD10-I70.0). Electronically Signed   By: Lovena Le M.D.   On: 06/24/2020 17:01   DG Chest Port 1 View  Result Date: 06/25/2020 CLINICAL DATA:  CHF EXAM: PORTABLE CHEST 1 VIEW COMPARISON:  06/24/2020 FINDINGS: Cardiomegaly. Mild vascular congestion. Prior CABG. No overt edema or confluent opacity. No effusion. No acute bony abnormality. IMPRESSION: Cardiomegaly.  Mild vascular congestion. Electronically Signed   By: Rolm Baptise M.D.   On: 06/25/2020 06:06   ECHOCARDIOGRAM COMPLETE  Result Date: 06/25/2020    ECHOCARDIOGRAM REPORT   Patient Name:   Paul Lowery Date of Exam: 06/25/2020 Medical Rec #:  [id number]      Height:       75.0 in Accession #:    [id number]     Weight:       234.0 lb Date of Birth:  1951-07-15      BSA:          2.346 m Patient Age:    66 years       BP:           157/83 mmHg Patient Gender: M              HR:           79 bpm. Exam Location:  Inpatient Procedure: 2D Echo, Cardiac Doppler and Color Doppler Indications:    Pre-op evaluation  History:        Patient has no prior history of Echocardiogram examinations.                 CHF, CAD, Prior CABG, COPD, Arrythmias:Atrial Fibrillation; Risk                 Factors:Hypertension and Dyslipidemia. Right hip fracture.  Sonographer:    Dustin Flock Referring Phys: [id number] [place]  1. Left ventricular ejection fraction, by estimation, is 55 to 60%. The left ventricle has normal function. The left  ventricle has no regional wall motion abnormalities. There is moderate concentric left ventricular hypertrophy. Left ventricular diastolic function could not be evaluated.  2. Right ventricular systolic function is normal. The right ventricular size is mildly enlarged. There is normal pulmonary artery systolic pressure. The estimated right ventricular systolic pressure is 58.5 mmHg.  3. Left atrial size was severely dilated.  4. Right atrial size was moderately dilated.  5. The mitral valve is grossly normal. Mild to moderate mitral valve regurgitation. No evidence of mitral stenosis.  6. The tricuspid valve is abnormal. Tricuspid valve regurgitation is moderate.  7. The aortic valve is tricuspid. There is mild calcification of the aortic valve. Aortic valve regurgitation is not visualized. Mild aortic valve sclerosis is present, with no evidence of aortic  valve stenosis.  8. The inferior vena cava is normal in size with <50% respiratory variability, suggesting right atrial pressure of 8 mmHg. FINDINGS  Left Ventricle: Left ventricular ejection fraction, by estimation, is 55 to 60%. The left ventricle has normal function. The left ventricle has no regional wall motion abnormalities. The left ventricular internal cavity size was normal in size. There is  moderate concentric left ventricular hypertrophy. Abnormal (paradoxical) septal motion consistent with post-operative status. Left ventricular diastolic function could not be evaluated due to atrial fibrillation. Left ventricular diastolic function could not be evaluated. Right Ventricle: The right ventricular size is mildly enlarged. No increase in right ventricular wall thickness. Right ventricular systolic function is normal. There is normal pulmonary artery systolic pressure. The tricuspid regurgitant velocity is 2.36  m/s, and with an assumed right atrial pressure of 8 mmHg, the estimated right ventricular systolic pressure is 82.4 mmHg. Left Atrium: Left atrial  size was severely dilated. Right Atrium: Right atrial size was moderately dilated. Pericardium: Trivial pericardial effusion is present. Mitral Valve: The mitral valve is grossly normal. Mild to moderate mitral valve regurgitation. No evidence of mitral valve stenosis. Tricuspid Valve: The tricuspid valve is abnormal. Tricuspid valve regurgitation is moderate . No evidence of tricuspid stenosis. Aortic Valve: The aortic valve is tricuspid. There is mild calcification of the aortic valve. Aortic valve regurgitation is not visualized. Mild aortic valve sclerosis is present, with no evidence of aortic valve stenosis. Pulmonic Valve: The pulmonic valve was grossly normal. Pulmonic valve regurgitation is trivial. No evidence of pulmonic stenosis. Aorta: The aortic root is normal in size and structure. Venous: The inferior vena cava is normal in size with less than 50% respiratory variability, suggesting right atrial pressure of 8 mmHg. IAS/Shunts: The atrial septum is grossly normal.  LEFT VENTRICLE PLAX 2D LVIDd:         5.10 cm  Diastology LVIDs:         3.80 cm  LV e' medial:    6.31 cm/s LV PW:         1.52 cm  LV E/e' medial:  16.6 LV IVS:        1.59 cm  LV e' lateral:   16.40 cm/s LVOT diam:     2.40 cm  LV E/e' lateral: 6.4 LV SV:         71 LV SV Index:   30 LVOT Area:     4.52 cm  RIGHT VENTRICLE RV Basal diam:  4.80 cm RV S prime:     7.62 cm/s TAPSE (M-mode): 2.0 cm LEFT ATRIUM              Index       RIGHT ATRIUM           Index LA diam:        4.70 cm  2.00 cm/m  RA Area:     30.40 cm LA Vol (A2C):   159.0 ml 67.78 ml/m RA Volume:   103.00 ml 43.91 ml/m LA Vol (A4C):   148.0 ml 63.09 ml/m LA Biplane Vol: 158.0 ml 67.35 ml/m  AORTIC VALVE LVOT Vmax:   99.00 cm/s LVOT Vmean:  66.400 cm/s LVOT VTI:    0.157 m  AORTA Ao Root diam: 3.60 cm MITRAL VALVE                TRICUSPID VALVE MV Area (PHT): 3.08 cm     TV Peak grad:   18.7 mmHg MV Decel Time: 246 msec  TV Vmax:        2.16 m/s MV E velocity:  105.00 cm/s  TR Peak grad:   22.3 mmHg                             TR Vmax:        236.00 cm/s                              SHUNTS                             Systemic VTI:  0.16 m                             Systemic Diam: 2.40 cm Eleonore Chiquito MD Electronically signed by Eleonore Chiquito MD Signature Date/Time: 06/25/2020/10:52:06 AM    Final    DG Hip Unilat With Pelvis 2-3 Views Right  Result Date: 06/24/2020 CLINICAL DATA:  Positive hip fracture, mechanical fall last night. EXAM: DG HIP (WITH OR WITHOUT PELVIS) 2-3V RIGHT COMPARISON:  None. FINDINGS: Comminuted intertrochanteric fracture of the right femur. Partially visualized surgical changes of the proximal left femur. Frontal views of the left femur otherwise grossly unremarkable. No hip dislocation bilaterally. No acute fracture of the bones of the pelvis. No diastasis. IMPRESSION: Comminuted intertrochanteric right femoral fracture. Electronically Signed   By: Iven Finn M.D.   On: 06/24/2020 17:03    Scheduled Meds: . acetylcysteine  4 mL Nebulization TID  . atorvastatin  80 mg Oral Daily  . carvedilol  25 mg Oral BID WC  . enoxaparin (LOVENOX) injection  40 mg Subcutaneous Q24H  . furosemide  80 mg Oral Daily  . lisinopril  5 mg Oral Daily  . mometasone-formoterol  2 puff Inhalation BID    Continuous Infusions: .  ceFAZolin (ANCEF) IV       LOS: 1 day     Annita Brod, MD [facility]   06/25/2020, 1:48 PM

## 2020-06-25 NOTE — Op Note (Signed)
Orthopaedic Surgery Operative Note (CSN: [id number])  Paul Lowery  July 23, 1951 Date of Surgery: 06/25/2020   Diagnoses:  Right intertrochanteric femur fracture  Procedure: Right cephalomedullary nail   Operative Finding Successful completion of the planned procedure.  Patient's bone quality was relatively good we had good reduction.  Post-operative plan: The patient will be weightbearing to tolerance.  The patient will be readmitted to the hospitalist.  DVT prophylaxis Lovenox 40 mg/day until mobilizing and then consider transition in clinic to alternative medicines.   Pain control with PRN pain medication preferring oral medicines.  Follow up plan will be scheduled in approximately 7 days for incision check and XR.  Post-Op Diagnosis: Same Surgeons:Primary: Hiram Gash, MD Assistants:Caroline McBane PA-C Location: [facility] OR ROOM 04 Anesthesia: General Antibiotics: Ancef 2 g with local vancomycin powder 1 g at the surgical site Tourniquet time: * No tourniquets in log * Estimated Blood Loss: Minimal Complications: None Specimens: None Implants: Implant Name Type Inv. Item Serial No. Manufacturer Lot No. LRB No. Used Action  NAIL FEM TROCH IM [id number] - [id number] Nail NAIL FEM TROCH IM [id number]  [employer]  Right 1 Implanted  SCREW LOCK CORT 5.0X40 - [id number] Screw SCREW LOCK CORT 5.0X40  [employer]  Right 1 Implanted  110 X 10.5 lag screw    [employer]  Right 1 Implanted    Indications for Surgery:   Paul Lowery is a 69 y.o. male with fall resulting in intertrochanteric femur fracture in the setting of multiple medical comorbidities.  Benefits and risks of operative and nonoperative management were discussed prior to surgery with patient/guardian(s) and informed consent form was completed.  Specific risks including infection, need for additional surgery, rotational instability, malunion, nonunion, hardware failure amongst others   Procedure:   The patient was identified  properly. Informed consent was obtained and the surgical site was marked. The patient was taken up to suite where general anesthesia was induced.  The patient was positioned supine on a fracture table.  The right hip was prepped and draped in the usual sterile fashion.  Timeout was performed before the beginning of the case.  The patient was placed supine on a fracture table and appropriate reduction was obtained and visualized on fluoroscopy prior to the beginning of the procedure.  We made an incision proximal to the greater trochanter and dissected down through the fascia.  We then carefully placed our starting awllocalizing under fluoroscopy prior to advancing the awl into the bone and sliding a ball-tipped guidewire through the awl into the femoral canal.  The wire was passed to an appropriate level past the isthmus.  We selected a length of nail noted above.  Entry reamer was used.  At this point we placed our nail localizing under fluoroscopy that it was at the appropriate level prior to using the outrigger device to pass a wire and then the cephalo-medullary screw.  The screw was locked proximally to avoid over collapse.  We took final shots at the proximal femur and then used the outrigger to place one distal interlock screw.  Final pictures were obtained.  The wounds were thoroughly irrigated closed in a multilayer fashion with absorable sutures.  A sterile dressing was placed.  The patient was awoken from general anesthesia and taken to the PACU in stable condition without complication.    Noemi Chapel, PA-C, present and scrubbed throughout the case, critical for completion in a timely fashion, and for retraction, instrumentation, closure.

## 2020-06-25 NOTE — Progress Notes (Signed)
RT to order extra trach and ambu bag for bedside. Patient stated he wears PMV and no oxygen at [facility], does not want to be on ATC here. RT to monitor.

## 2020-06-25 NOTE — Progress Notes (Signed)
RN called and stated patient was struggling on RA. When I arrived, patient was coughing but SAT 96%. Once coughing subsided, no distress noted. Patient initially declinced ATC once again, but then agreed to be placed on it. 28% ATC placed, SAT 98%. Patient has strong productive cough, no distress noted. Extra trach and ambu bag arrived, and are currently at bedside.

## 2020-06-25 NOTE — Interval H&P Note (Signed)
History and Physical Interval Note:  06/25/2020 3:47 PM  Paul Lowery  has presented today for surgery, with the diagnosis of Right intertroch fracture.  The various methods of treatment have been discussed with the patient and family. After consideration of risks, benefits and other options for treatment, the patient has consented to  Procedure(s): INTRAMEDULLARY (IM) NAIL INTERTROCHANTRIC (Right) as a surgical intervention.  The patient's history has been reviewed, patient examined, no change in status, stable for surgery.  I have reviewed the patient's chart and labs.  Questions were answered to the patient's satisfaction.     Hiram Gash

## 2020-06-25 NOTE — Anesthesia Postprocedure Evaluation (Signed)
Anesthesia Post Note  Patient: Paul Lowery  Procedure(s) Performed: INTRAMEDULLARY (IM) NAIL INTERTROCHANTRIC (Right )     Patient location during evaluation: PACU Anesthesia Type: General Level of consciousness: awake and alert Pain management: pain level controlled Vital Signs Assessment: post-procedure vital signs reviewed and stable Respiratory status: spontaneous breathing, nonlabored ventilation, respiratory function stable and patient connected to nasal cannula oxygen Cardiovascular status: blood pressure returned to baseline and stable Postop Assessment: no apparent nausea or vomiting Anesthetic complications: no   No complications documented.  Last Vitals:  Vitals:   06/25/20 1935 06/25/20 1940  BP:    Pulse: 87 90  Resp: 20 11  Temp:    SpO2: 100% 99%    Last Pain:  Vitals:   06/25/20 1925  TempSrc:   PainSc: 6                  Prithvi Kooi COKER

## 2020-06-26 LAB — BASIC METABOLIC PANEL
Anion gap: 10 (ref 5–15)
BUN: 18 mg/dL (ref 8–23)
CO2: 27 mmol/L (ref 22–32)
Calcium: 8.7 mg/dL — ABNORMAL LOW (ref 8.9–10.3)
Chloride: 99 mmol/L (ref 98–111)
Creatinine, Ser: 0.79 mg/dL (ref 0.61–1.24)
GFR calc Af Amer: >60 mL/min (ref >60)
GFR calc non Af Amer: >60 mL/min (ref >60)
Glucose, Bld: 146 mg/dL — ABNORMAL HIGH (ref 70–99)
Potassium: 3.4 mmol/L — ABNORMAL LOW (ref 3.5–5.1)
Sodium: 136 mmol/L (ref 135–145)

## 2020-06-26 LAB — CBC
HCT: 26.8 % — ABNORMAL LOW (ref 39.0–52.0)
Hemoglobin: 8.1 g/dL — ABNORMAL LOW (ref 13.0–17.0)
MCH: 24.3 pg — ABNORMAL LOW (ref 26.0–34.0)
MCHC: 30.2 g/dL (ref 30.0–36.0)
MCV: 80.2 fL (ref 80.0–100.0)
Platelets: 174 10*3/uL (ref 150–400)
RBC: 3.34 MIL/uL — ABNORMAL LOW (ref 4.22–5.81)
RDW: 16.4 % — ABNORMAL HIGH (ref 11.5–15.5)
WBC: 7 10*3/uL (ref 4.0–10.5)
nRBC: 0 % (ref 0.0–0.2)

## 2020-06-26 LAB — GLUCOSE, CAPILLARY
Glucose-Capillary: 118 mg/dL — ABNORMAL HIGH (ref 70–99)
Glucose-Capillary: 119 mg/dL — ABNORMAL HIGH (ref 70–99)
Glucose-Capillary: 124 mg/dL — ABNORMAL HIGH (ref 70–99)
Glucose-Capillary: 132 mg/dL — ABNORMAL HIGH (ref 70–99)
Glucose-Capillary: 156 mg/dL — ABNORMAL HIGH (ref 70–99)

## 2020-06-26 NOTE — Evaluation (Signed)
Passy-Muir Speaking Valve - Evaluation Patient Details  Name: Paul Lowery MRN: [id number] Date of Birth: [birth year]  Today's Date: 06/26/2020 Time: 31-1440 SLP Time Calculation (min) (ACUTE ONLY): 20 min  Past Medical History: No past medical history on file. HPI:  Paul Lowery is a 69 y.o. 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 Fib (off Eliquis in 2020), chronic diastolic CHF (LVEF 45 to 46% 2019), hypertension hyperlipidemia, COPD Gold stage II (FEV1/FVC 59% in 2019), with [facility] resident presented with fall and right hip pain.  Found to have Right hip comminuted intertrochanteric fracture, now s/p IM nail.   Assessment / Plan / Recommendation Clinical Impression  Per patient report, he was able to functional utilize PMSV at [facility] prior to arrival, lost valve during surgery this admission. Valve placed on shiley, cuff delfated at baseline, without incidence for approximately 20 minutes. All vital signs remained WFL and patient without evidence of distress of air trapping with valve removal. Vocal quality hoarse and with low intensity, suspected baseline given h/o cancer however unclear based on lack of report. Valve removed by SLP prior to leaving room. Patient may use valve with full staff supervision at this time until SLP ensures safety over multiple sessions.  SLP Visit Diagnosis: Aphonia (R49.1)    SLP Assessment  Patient needs continued Speech Lanaguage Pathology Services    Follow Up Recommendations  LTACH    Frequency and Duration min 2x/week  1 week    PMSV Trial PMSV was placed for: 20 minutes Able to redirect subglottic air through upper airway: Yes Able to Attain Phonation: Yes Voice Quality: Hoarse;Breathy;Low vocal intensity Able to Expectorate Secretions: Yes Level of Secretion Expectoration with PMSV: Oral Breath Support for Phonation:  Mildly decreased Intelligibility: Intelligibility reduced Word: 50-74% accurate Phrase: 50-74% accurate Sentence: 50-74% accurate Conversation: 50-74% accurate Respirations During Trial:  (WFL) SpO2 During Trial: 94 % Pulse During Trial:  [facility]) Behavior: Alert;Cooperative   Tracheostomy Tube  Additional Tracheostomy Tube Assessment Fenestrated: No Trach Collar Period: 24 hours Secretion Description: minimal Level of Secretion Expectoration: Tracheal    Vent Dependency  Vent Dependent: No FiO2 (%): 28 %    Cuff Deflation Trial  Paul Lagunes MA, CCC-SLP    Tolerated Cuff Deflation: Yes Length of Time for Cuff Deflation Trial: deflated at baseline Behavior: Alert;Cooperative        Paul Lowery 06/26/2020, 3:55 PM

## 2020-06-26 NOTE — Plan of Care (Signed)

## 2020-06-26 NOTE — Progress Notes (Signed)
   ORTHOPAEDIC PROGRESS NOTE  s/p Procedure(s): INTRAMEDULLARY (IM) NAIL INTERTROCHANTRIC on 06/25/2020 with Dr. Griffin Basil  SUBJECTIVE: Reports some pain about operative site. He rolled over in bed onto his right hip causing his distal bandage to pull off. No other complaints.  OBJECTIVE: PE: General: alert, no acute distress RLE: proximal dressing CDI, distal dressing has been partially pulled off, incision CDI, new Aquacel placed, intact EHL/TA/GSC, endorses distal sensation, warm well perfused foot   Vitals:   06/26/20 0836 06/26/20 0859  BP:  (!) 135/122  Pulse:  96  Resp:  18  Temp:  99.1 F (37.3 C)  SpO2: 94% 100%     ASSESSMENT: Paul Lowery is a 69 y.o. male status post above. POD#1  PLAN: Weightbearing: WBAT RLE Insicional and dressing care: Reinforce dressings as needed Orthopedic device(s): None Showering: Post-op day #2 with assistance VTE prophylaxis: Lovenox 40mg  qd  Pain control: PRN pain medications, preferring oral medications Follow - up plan: 1 week in office with Dr. Griffin Basil Dispo: TBD. PT/OT evaluations pending. He currently resides at [facility].    Noemi Chapel, PA-C 06/26/2020

## 2020-06-26 NOTE — Progress Notes (Signed)
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   Subjective:  Patient without acute complaints.  Still has hip pain but thinks it may be better since surgery.   Objective: Vitals:   06/26/20 0859 06/26/20 1205 06/26/20 1559 06/26/20 1709  BP: (!) 135/122   117/61  Pulse: 96 78 78 64  Resp: 18 12 17 18   Temp: 99.1 F (37.3 C)   98.5 F (36.9 C)  TempSrc: Oral   Oral  SpO2: 100% 98% 93% 98%  Weight:      Height:        Intake/Output Summary (Last 24 hours) at 06/26/2020 1726 Last data filed at 06/26/2020 1100 Gross per 24 hour  Intake 360.42 ml  Output 350 ml  Net 10.42 ml   Filed Weights   06/25/20 1603  Weight: 106.1 kg     Exam:  General: With trach in place missing Passy-Muir valve lying in bed in no acute distress, talking and understandable. Eyes: sclera anicteric, conjuctiva mild injection bilaterally CVS: S1-S2, regular  Respiratory: Coarse breath sounds bilaterally GI: NABS, soft, NT  LE: No edema.  Neuro: grossly nonfocal.    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, SLP consulted for new Passy-Muir  valve  Dysphagia Patient is wanting to eat, SLP consulted for swallow evaluation  COPD No evidence for acute flare As needed inhaled bronchodilators ordered Continue Pulmicort  Atrial fibrillation Rate controlled on carvedilol Anticoagulation to be started as warranted  HTN Continue lisinopril, carvedilol   DVT prophylaxis: CAD Code Status: Full Family Communication: None Disposition Plan:   Patient is from: Home  Anticipated Discharge Location: SNF  Barriers to Discharge: Recent surgery  Is patient medically stable for Discharge: No   Consultants:  Orthopedic  Procedures:  Status post cephalic medullary nail placement yesterday  Antimicrobials:  None   Data Reviewed:  Basic Metabolic Panel: Recent Labs  Lab 06/24/20 1732 06/25/20 0608 06/26/20 0212  NA 135 133* 136  K 4.9 3.4* 3.4*  CL 97* 99 99  CO2 30 30 27   GLUCOSE 102* 102* 146*  BUN 21 16 18   CREATININE 0.72 0.70 0.79  CALCIUM 9.1 9.1 8.7*   Liver Function Tests: No results for input(s): AST, ALT, ALKPHOS, BILITOT, PROT, ALBUMIN in the last 168 hours. No results for input(s): LIPASE, AMYLASE in the last 168 hours. No results for input(s): AMMONIA in the last 168 hours. CBC: Recent Labs  Lab 06/24/20 1732 06/25/20 0608 06/26/20 0212  WBC 5.2 4.8 7.0  NEUTROABS 3.9  --   --   HGB 9.0* 8.7* 8.1*  HCT 29.2* 28.6* 26.8*  MCV 80.0 79.4* 80.2  PLT 253 185 174   Cardiac Enzymes: No results for input(s): CKTOTAL, CKMB,  CKMBINDEX, TROPONINI in the last 168 hours. BNP (last 3 results) No results for input(s): PROBNP in the last 8760 hours. CBG: Recent Labs  Lab 06/25/20 1915 06/26/20 0012 06/26/20 0403 06/26/20 1659  GLUCAP 119* 119* 156* 124*    Recent Results (from the past 240 hour(s))  SARS Coronavirus 2 by RT PCR (hospital order, performed in [facility] hospital lab) Nasopharyngeal Nasopharyngeal Swab     Status: None   Collection Time: 06/24/20  5:19 PM   Specimen:  Nasopharyngeal Swab  Result Value Ref Range Status   SARS Coronavirus 2 NEGATIVE NEGATIVE Final    Comment: (NOTE) SARS-CoV-2 target nucleic acids are NOT DETECTED.  The SARS-CoV-2 RNA is generally detectable in upper and lower respiratory specimens during the acute phase of infection. The lowest concentration of SARS-CoV-2 viral copies this assay can detect is 250 copies / mL. A negative result does not preclude SARS-CoV-2 infection and should not be used as the sole basis for treatment or other patient management decisions.  A negative result may occur with improper specimen collection / handling, submission of specimen other than nasopharyngeal swab, presence of viral mutation(s) within the areas targeted by this assay, and inadequate number of viral copies (<250 copies / mL). A negative result must be combined with clinical observations, patient history, and epidemiological information.  Fact Sheet for Patients:   StrictlyIdeas.no  Fact Sheet for Healthcare Providers: BankingDealers.co.za  This test is not yet approved or  cleared by the [place] FDA and has been authorized for detection and/or diagnosis of SARS-CoV-2 by FDA under an Emergency Use Authorization (EUA).  This EUA will remain in effect (meaning this test can be used) for the duration of the COVID-19 declaration under Section 564(b)(1) of the Act, 21 U.S.C. section 360bbb-3(b)(1), unless the authorization is terminated or revoked sooner.  Performed at [facility], [place] [address], [place], [place] [address]   Surgical pcr screen     Status: None   Collection Time: 06/25/20 12:37 AM   Specimen: Nasal Mucosa; Nasal Swab  Result Value Ref Range Status   MRSA, PCR NEGATIVE NEGATIVE Final   Staphylococcus aureus NEGATIVE NEGATIVE Final    Comment: (NOTE) The Xpert SA Assay (FDA approved for NASAL specimens in patients 69 years of age and older), is one  component of a comprehensive surveillance program. It is not intended to diagnose infection nor to guide or monitor treatment. Performed at [facility], [place] [address] [place], [place] [address]       Studies: DG Chest Port 1 View  Result Date: 06/25/2020 CLINICAL DATA:  CHF EXAM: PORTABLE CHEST 1 VIEW COMPARISON:  06/24/2020 FINDINGS: Cardiomegaly. Mild vascular congestion. Prior CABG. No overt edema or confluent opacity. No effusion. No acute bony abnormality. IMPRESSION: Cardiomegaly.  Mild vascular congestion. Electronically Signed   By: Rolm Baptise M.D.   On: 06/25/2020 06:06   DG C-Arm 1-60 Min  Result Date: 06/25/2020 CLINICAL DATA:  ORIF right femoral fracture EXAM: DG C-ARM 1-60 MIN; RIGHT FEMUR 2 VIEWS FLUOROSCOPY TIME:  Fluoroscopy Time:  55 seconds Radiation Exposure Index (if provided by the fluoroscopic device): 8.61 mGy Number of Acquired Spot Images: 4 images COMPARISON:  06/24/2020 FINDINGS: 4 fluoroscopic images are obtained during the performance of the procedure and are provided for interpretation only. Intramedullary rod with dynamic screw and distal interlocking screw traverses a comminuted intertrochanteric right hip fracture. Alignment is near anatomic. IMPRESSION: 1. ORIF right hip fracture as above. Electronically Signed  By: Randa Ngo M.D.   On: 06/25/2020 19:31   ECHOCARDIOGRAM COMPLETE  Result Date: 06/25/2020    ECHOCARDIOGRAM REPORT   Patient Name:   Paul Lowery Date of Exam: 06/25/2020 Medical Rec #:  [id number]      Height:       75.0 in Accession #:    [id number]     Weight:       234.0 lb Date of Birth:  1950/12/09      BSA:          2.346 m Patient Age:    65 years       BP:           157/83 mmHg Patient Gender: M              HR:           79 bpm. Exam Location:  Inpatient Procedure: 2D Echo, Cardiac Doppler and Color Doppler Indications:    Pre-op evaluation  History:        Patient has no prior history of Echocardiogram examinations.                  CHF, CAD, Prior CABG, COPD, Arrythmias:Atrial Fibrillation; Risk                 Factors:Hypertension and Dyslipidemia. Right hip fracture.  Sonographer:    Dustin Flock Referring Phys: [id number] [place]  1. Left ventricular ejection fraction, by estimation, is 55 to 60%. The left ventricle has normal function. The left ventricle has no regional wall motion abnormalities. There is moderate concentric left ventricular hypertrophy. Left ventricular diastolic function could not be evaluated.  2. Right ventricular systolic function is normal. The right ventricular size is mildly enlarged. There is normal pulmonary artery systolic pressure. The estimated right ventricular systolic pressure is 47.6 mmHg.  3. Left atrial size was severely dilated.  4. Right atrial size was moderately dilated.  5. The mitral valve is grossly normal. Mild to moderate mitral valve regurgitation. No evidence of mitral stenosis.  6. The tricuspid valve is abnormal. Tricuspid valve regurgitation is moderate.  7. The aortic valve is tricuspid. There is mild calcification of the aortic valve. Aortic valve regurgitation is not visualized. Mild aortic valve sclerosis is present, with no evidence of aortic valve stenosis.  8. The inferior vena cava is normal in size with <50% respiratory variability, suggesting right atrial pressure of 8 mmHg. FINDINGS  Left Ventricle: Left ventricular ejection fraction, by estimation, is 55 to 60%. The left ventricle has normal function. The left ventricle has no regional wall motion abnormalities. The left ventricular internal cavity size was normal in size. There is  moderate concentric left ventricular hypertrophy. Abnormal (paradoxical) septal motion consistent with post-operative status. Left ventricular diastolic function could not be evaluated due to atrial fibrillation. Left ventricular diastolic function could not be evaluated. Right Ventricle: The right ventricular size is mildly  enlarged. No increase in right ventricular wall thickness. Right ventricular systolic function is normal. There is normal pulmonary artery systolic pressure. The tricuspid regurgitant velocity is 2.36  m/s, and with an assumed right atrial pressure of 8 mmHg, the estimated right ventricular systolic pressure is 54.6 mmHg. Left Atrium: Left atrial size was severely dilated. Right Atrium: Right atrial size was moderately dilated. Pericardium: Trivial pericardial effusion is present. Mitral Valve: The mitral valve is grossly normal. Mild to moderate mitral valve regurgitation. No evidence of mitral valve stenosis. Tricuspid Valve: The tricuspid valve  is abnormal. Tricuspid valve regurgitation is moderate . No evidence of tricuspid stenosis. Aortic Valve: The aortic valve is tricuspid. There is mild calcification of the aortic valve. Aortic valve regurgitation is not visualized. Mild aortic valve sclerosis is present, with no evidence of aortic valve stenosis. Pulmonic Valve: The pulmonic valve was grossly normal. Pulmonic valve regurgitation is trivial. No evidence of pulmonic stenosis. Aorta: The aortic root is normal in size and structure. Venous: The inferior vena cava is normal in size with less than 50% respiratory variability, suggesting right atrial pressure of 8 mmHg. IAS/Shunts: The atrial septum is grossly normal.  LEFT VENTRICLE PLAX 2D LVIDd:         5.10 cm  Diastology LVIDs:         3.80 cm  LV e' medial:    6.31 cm/s LV PW:         1.52 cm  LV E/e' medial:  16.6 LV IVS:        1.59 cm  LV e' lateral:   16.40 cm/s LVOT diam:     2.40 cm  LV E/e' lateral: 6.4 LV SV:         71 LV SV Index:   30 LVOT Area:     4.52 cm  RIGHT VENTRICLE RV Basal diam:  4.80 cm RV S prime:     7.62 cm/s TAPSE (M-mode): 2.0 cm LEFT ATRIUM              Index       RIGHT ATRIUM           Index LA diam:        4.70 cm  2.00 cm/m  RA Area:     30.40 cm LA Vol (A2C):   159.0 ml 67.78 ml/m RA Volume:   103.00 ml 43.91 ml/m LA  Vol (A4C):   148.0 ml 63.09 ml/m LA Biplane Vol: 158.0 ml 67.35 ml/m  AORTIC VALVE LVOT Vmax:   99.00 cm/s LVOT Vmean:  66.400 cm/s LVOT VTI:    0.157 m  AORTA Ao Root diam: 3.60 cm MITRAL VALVE                TRICUSPID VALVE MV Area (PHT): 3.08 cm     TV Peak grad:   18.7 mmHg MV Decel Time: 246 msec     TV Vmax:        2.16 m/s MV E velocity: 105.00 cm/s  TR Peak grad:   22.3 mmHg                             TR Vmax:        236.00 cm/s                              SHUNTS                             Systemic VTI:  0.16 m                             Systemic Diam: 2.40 cm Eleonore Chiquito MD Electronically signed by Eleonore Chiquito MD Signature Date/Time: 06/25/2020/10:52:06 AM    Final    DG Hip Port Unilat With Pelvis 1V Right  Result Date: 06/25/2020 CLINICAL DATA:  69 year old male status post  right femur ORIF. EXAM: DG HIP (WITH OR WITHOUT PELVIS) 1V PORT RIGHT COMPARISON:  Intraoperative images today. FINDINGS: Portable AP supine views at 1936 hours. Right femur intramedullary rod with proximal interlocking dynamic hip screw and distal interlocking cortical screw. Stable alignment about the intertrochanteric fracture, near anatomic. Chronic deformity of the visible left femur with intramedullary rod appears stable. No new osseous abnormality. IMPRESSION: Right femur ORIF today with no adverse features. Electronically Signed   By: Genevie Ann M.D.   On: 06/25/2020 20:13   DG FEMUR, MIN 2 VIEWS RIGHT  Result Date: 06/25/2020 CLINICAL DATA:  ORIF right femoral fracture EXAM: DG C-ARM 1-60 MIN; RIGHT FEMUR 2 VIEWS FLUOROSCOPY TIME:  Fluoroscopy Time:  55 seconds Radiation Exposure Index (if provided by the fluoroscopic device): 8.61 mGy Number of Acquired Spot Images: 4 images COMPARISON:  06/24/2020 FINDINGS: 4 fluoroscopic images are obtained during the performance of the procedure and are provided for interpretation only. Intramedullary rod with dynamic screw and distal interlocking screw traverses a  comminuted intertrochanteric right hip fracture. Alignment is near anatomic. IMPRESSION: 1. ORIF right hip fracture as above. Electronically Signed   By: Randa Ngo M.D.   On: 06/25/2020 19:31     Scheduled Meds: . acetaminophen  1,000 mg Oral Q8H  . atorvastatin  80 mg Oral Daily  . budesonide (PULMICORT) nebulizer solution  0.25 mg Nebulization BID  . carvedilol  25 mg Oral BID WC  . celecoxib  100 mg Oral BID  . docusate  100 mg Per Tube BID  . enoxaparin (LOVENOX) injection  40 mg Subcutaneous Q24H  . feeding supplement (PROSource TF)  90 mL Per Tube BID  . free water  175 mL Per Tube Q4H  . furosemide  80 mg Oral Daily  . ipratropium-albuterol  3 mL Nebulization BID  . lisinopril  5 mg Oral Daily   Continuous Infusions: . feeding supplement (OSMOLITE 1.5 CAL) 1,000 mL (06/26/20 0005)    Principal Problem:   Closed comminuted fracture of hip, right, initial encounter ([place]) Active Problems:   Chronic diastolic CHF (congestive heart failure) (HCC)   Status post tracheostomy ([place])   Squamous cell carcinoma of tonsillar pillar (HCC)   COPD (chronic obstructive pulmonary disease) ([place])   Essential hypertension   Atrial fibrillation, chronic ([place])   Hyperlipidemia   Malnutrition of moderate degree     Caytlyn Evers Tublu Koston [place], [facility]  If 7PM-7AM, please contact night-coverage [email or web address] Password [email or web address] 06/26/2020, 5:26 PM    LOS: 2 days

## 2020-06-26 NOTE — Evaluation (Signed)
Physical Therapy Evaluation Patient Details Name: Paul Lowery MRN: [id number] DOB: [birth year] Today's Date: 06/26/2020   History of Present Illness  Paul Lowery is a 69 y.o. 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 Fib (off Eliquis in 2020), chronic diastolic CHF (LVEF 45 to 40% 2019), hypertension hyperlipidemia, COPD Gold stage II (FEV1/FVC 59% in 2019), with LTAC resident presented with fall and right hip pain.  Found to have Right hip comminuted intertrochanteric fracture, now s/p IM nail.  Clinical Impression  Patient presents with decreased mobility due to general weakness, pain R hip and limited ROM and activity tolerance.  He was able to get up to EOB with mod A +2 and stood x 2 from heightened EOB with mod A of 2, but was unable to take any steps due to pain and weakness.  He was previously able to mobilize without a walking device.  Feel he can return to [facility] if can get rehab there, otherwise, may need SNF at d/c.  PT to follow acutely.     Follow Up Recommendations LTACH    Equipment Recommendations  Rolling walker with 5" wheels    Recommendations for Other Services       Precautions / Restrictions Precautions Precautions: Fall Precaution Comments: trach/PEG (missing his PMSV) Restrictions Weight Bearing Restrictions: Yes RLE Weight Bearing: Weight bearing as tolerated      Mobility  Bed Mobility Overal bed mobility: Needs Assistance Bed Mobility: Supine to Sit;Sit to Supine     Supine to sit: Mod assist;HOB elevated;+2 for safety/equipment Sit to supine: Max assist;+2 for physical assistance   General bed mobility comments: up to EOB assisting with R LE and for lifting trunk, pt pulling up; to supine NT in room and lifted legs while I helped ease shoulders/trunk to supine  Transfers Overall transfer level: Needs assistance Equipment used:  Rolling walker (2 wheeled) Transfers: Sit to/from Stand Sit to Stand: Mod assist;+2 physical assistance;From elevated surface         General transfer comment: stood x 2 from elevated height bed with increased time, lifting help and cues for hand placement,  Unable to effectively take steps to chair so stood second time to change linen and for hygiene then assisted to supine  Ambulation/Gait                Stairs            Wheelchair Mobility    Modified Rankin (Stroke Patients Only)       Balance Overall balance assessment: Needs assistance Sitting-balance support: Feet supported Sitting balance-Leahy Scale: Fair Sitting balance - Comments: EOB no support needed                                     Pertinent Vitals/Pain Pain Assessment: 0-10 Pain Score: 10-Worst pain ever Pain Location: R hip Pain Descriptors / Indicators: Discomfort;Grimacing;Operative site guarding Pain Intervention(s): Monitored during session;Limited activity within patient's tolerance;RN gave pain meds during session    Home Living Family/patient expects to be discharged to:: Other (Comment) (was at [facility])                      Prior Function Level of Independence: Independent         Comments: reports did not use a device for ambulation; difficulty understanding  much else due to pt not self occluding to phonate and missing his speaking valve since went to surgery     Hand Dominance        Extremity/Trunk Assessment   Upper Extremity Assessment Upper Extremity Assessment: Overall WFL for tasks assessed    Lower Extremity Assessment Lower Extremity Assessment: RLE deficits/detail RLE Deficits / Details: AAROM limited hip flexion due to pain about 40 degrees in supine; ankle AROM WFL, strength knee extension 2/5       Communication   Communication: Tracheostomy  Cognition Arousal/Alertness: Awake/alert Behavior During Therapy: Anxious Overall  Cognitive Status: Difficult to assess                                 General Comments: distracted by pain and frustrated with loss of his valve so difficult to understand as speaking without self occluding      General Comments      Exercises General Exercises - Lower Extremity Ankle Circles/Pumps: AROM;Both;10 reps;Supine Quad Sets: AROM;Right;5 reps;Supine Heel Slides: AAROM;Right;5 reps;Supine   Assessment/Plan    PT Assessment Patient needs continued PT services  PT Problem List Decreased strength;Decreased mobility;Decreased safety awareness;Decreased balance;Decreased knowledge of use of DME;Pain;Decreased activity tolerance;Decreased range of motion       PT Treatment Interventions DME instruction;Therapeutic activities;Gait training;Therapeutic exercise;Balance training;Functional mobility training;Patient/family education    PT Goals (Current goals can be found in the Care Plan section)  Acute Rehab PT Goals Patient Stated Goal: happy to mobilize despite pain PT Goal Formulation: With patient Time For Goal Achievement: 07/10/20 Potential to Achieve Goals: Good    Frequency Min 3X/week   Barriers to discharge        Co-evaluation               AM-PAC PT "6 Clicks" Mobility  Outcome Measure Help needed turning from your back to your side while in a flat bed without using bedrails?: A Lot Help needed moving from lying on your back to sitting on the side of a flat bed without using bedrails?: Total Help needed moving to and from a bed to a chair (including a wheelchair)?: Total Help needed standing up from a chair using your arms (e.g., wheelchair or bedside chair)?: A Lot Help needed to walk in hospital room?: Total Help needed climbing 3-5 steps with a railing? : Total 6 Click Score: 8    End of Session Equipment Utilized During Treatment: Gait belt Activity Tolerance: Patient limited by pain Patient left: in bed;with call bell/phone  within reach;with bed alarm set Nurse Communication: Mobility status PT Visit Diagnosis: Other abnormalities of gait and mobility (R26.89);Difficulty in walking, not elsewhere classified (R26.2);Pain;History of falling (Z91.81) Pain - Right/Left: Right Pain - part of body: Hip    Time: 2725-3664 PT Time Calculation (min) (ACUTE ONLY): 43 min   Charges:   PT Evaluation $PT Eval High Complexity: 1 High PT Treatments $Therapeutic Exercise: 8-22 mins $Therapeutic Activity: 8-22 mins        Magda Kiel, PT Acute Rehabilitation Services QIHKV:[phone] Office:[phone] 06/26/2020   Reginia Naas 06/26/2020, 2:03 PM

## 2020-06-26 NOTE — Evaluation (Signed)
Clinical/Bedside Swallow Evaluation Patient Details  Name: Paul Lowery MRN: [id number] Date of Birth: 08-23-1951  Today's Date: 06/26/2020 Time: SLP Start Time (ACUTE ONLY): 1420 SLP Stop Time (ACUTE ONLY): 1440 SLP Time Calculation (min) (ACUTE ONLY): 20 min  Past Medical History: No past medical history on file. Past Surgical History:The histories are not reviewed yet. Please review them in the "History" navigator section and refresh this [place]. HPI:  Paul Lowery is a 69 y.o. 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 Fib (off Eliquis in 2020), chronic diastolic CHF (LVEF 45 to 78% 2019), hypertension hyperlipidemia, COPD Gold stage II (FEV1/FVC 59% in 2019), with LTAC resident presented with fall and right hip pain.  Found to have Right hip comminuted intertrochanteric fracture, now s/p IM nail.   Assessment / Plan / Recommendation Clinical Impression  Bedside swallow evaluation complete. Per chart, patient NPO with PEG tube however noted water with straw at bedside table and patient reports that he was consuming liquids "of all kind" at [facility] prior to admission. He also reports that he was working towards solid food. Attempted to contact [facility] without response today for clarification of previous swallowing function and po recommendations. Oral motor exam unremarkable. Patient able to consume sips of thin water with delayed cough suggestive of decreased airway protection. Given history and diagnosis (tonsillar cancer s/p radiation treatment), instrumental evaluation recommended to evaluate swallowing physiology prior to initiating a po diet. Will attempt to contact [facility] on 9/20 and then proceed with instrumental exam unless otherwise indicated by SLP at [facility].   SLP Visit Diagnosis: Aphonia (R49.1);Dysphagia, unspecified (R13.10)    Aspiration Risk       Diet  Recommendation NPO   Medication Administration: Via alternative means    Other  Recommendations Oral Care Recommendations: Oral care QID   Follow up Recommendations LTACH      Frequency and Duration min 2x/week  2 weeks       Prognosis Prognosis for Safe Diet Advancement: Fair      Swallow Study   General HPI: Paul Lowery is a 69 y.o. 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 Fib (off Eliquis in 2020), chronic diastolic CHF (LVEF 45 to 29% 2019), hypertension hyperlipidemia, COPD Gold stage II (FEV1/FVC 59% in 2019), with LTAC resident presented with fall and right hip pain.  Found to have Right hip comminuted intertrochanteric fracture, now s/p IM nail. Type of Study: Bedside Swallow Evaluation Previous Swallow Assessment: none in chart Diet Prior to this Study: NPO;PEG tube (note water at bedside) Temperature Spikes Noted: No Respiratory Status: Trach Collar;Trach Trach Size and Type: #6;Cuff;Extra long;With PMSV in place History of Recent Intubation:  (unknown) Behavior/Cognition: Alert;Cooperative;Pleasant mood Oral Cavity Assessment: Within Functional Limits Oral Care Completed by SLP: Recent completion by staff Oral Cavity - Dentition: Dentures, top;Dentures, bottom;Dentures, not available Vision: Functional for self-feeding Self-Feeding Abilities: Able to feed self Patient Positioning: Upright in bed Baseline Vocal Quality: Hoarse;Low vocal intensity (with PMV in place) Volitional Cough: Strong Volitional Swallow: Able to elicit    Oral/Motor/Sensory Function Overall Oral Motor/Sensory Function: Within functional limits   Ice Chips Ice chips: Not tested   Thin Liquid Thin Liquid: Impaired Presentation: Straw;Self Fed Pharyngeal  Phase Impairments: Cough - Delayed    Nectar Thick Nectar Thick Liquid: Not tested   Honey Thick  Honey Thick Liquid: Not  tested   Puree Puree: Not tested   Solid     Solid: Not tested     Gabriel Rainwater MA, CCC-SLP   [name] 06/26/2020,4:01 PM

## 2020-06-27 LAB — GLUCOSE, CAPILLARY
Glucose-Capillary: 111 mg/dL — ABNORMAL HIGH (ref 70–99)
Glucose-Capillary: 124 mg/dL — ABNORMAL HIGH (ref 70–99)
Glucose-Capillary: 131 mg/dL — ABNORMAL HIGH (ref 70–99)
Glucose-Capillary: 114 mg/dL — ABNORMAL HIGH (ref 70–99)
Glucose-Capillary: 130 mg/dL — ABNORMAL HIGH (ref 70–99)

## 2020-06-27 LAB — CBC
HCT: 24.5 % — ABNORMAL LOW (ref 39.0–52.0)
Hemoglobin: 7.4 g/dL — ABNORMAL LOW (ref 13.0–17.0)
MCH: 24.1 pg — ABNORMAL LOW (ref 26.0–34.0)
MCHC: 30.2 g/dL (ref 30.0–36.0)
MCV: 79.8 fL — ABNORMAL LOW (ref 80.0–100.0)
Platelets: 163 10*3/uL (ref 150–400)
RBC: 3.07 MIL/uL — ABNORMAL LOW (ref 4.22–5.81)
RDW: 16.6 % — ABNORMAL HIGH (ref 11.5–15.5)
WBC: 6.6 10*3/uL (ref 4.0–10.5)
nRBC: 0 % (ref 0.0–0.2)

## 2020-06-27 LAB — BASIC METABOLIC PANEL
Anion gap: 9 (ref 5–15)
BUN: 29 mg/dL — ABNORMAL HIGH (ref 8–23)
CO2: 28 mmol/L (ref 22–32)
Calcium: 8.6 mg/dL — ABNORMAL LOW (ref 8.9–10.3)
Chloride: 98 mmol/L (ref 98–111)
Creatinine, Ser: 0.87 mg/dL (ref 0.61–1.24)
GFR calc Af Amer: >60 mL/min (ref >60)
GFR calc non Af Amer: >60 mL/min (ref >60)
Glucose, Bld: 146 mg/dL — ABNORMAL HIGH (ref 70–99)
Potassium: 3.6 mmol/L (ref 3.5–5.1)
Sodium: 135 mmol/L (ref 135–145)

## 2020-06-27 MED ORDER — TRAZODONE HCL 50 MG PO TABS
50.0000 mg | ORAL_TABLET | Freq: Once | ORAL | Status: AC
  Administered 2020-06-27: 50 mg via ORAL
  Filled 2020-06-27: qty 1

## 2020-06-27 NOTE — Progress Notes (Signed)
   ORTHOPAEDIC PROGRESS NOTE  s/p Procedure(s): INTRAMEDULLARY (IM) NAIL INTERTROCHANTRIC on 06/25/2020 with Dr. Griffin Basil  SUBJECTIVE: Reports some pain about operative site, but he thinks this is getting better. Does ask for another oxycodone. He thinks he did well with therapy yesterday. No other complaints.  OBJECTIVE: PE: General: alert, no acute distress RLE: proximal dressing falling off, this was removed, incision CDI, new dressing placed, distal dressing has been reinforced no signs of strike through noted, intact EHL/TA/GSC, endorses distal sensation, warm well perfused foot   Vitals:   06/27/20 0813 06/27/20 0848  BP:  (!) 136/56  Pulse: 83 74  Resp:  18  Temp:  98.7 F (37.1 C)  SpO2: 98% 100%    ASSESSMENT: Paul Lowery is a 69 y.o. male status post above. POD#2  PLAN: Weightbearing: WBAT RLE Insicional and dressing care: Reinforce dressings as needed Orthopedic device(s): None Showering: Post-op day #2 with assistance VTE prophylaxis: Lovenox 40mg  qd  Pain control: PRN pain medications, preferring oral medications Follow - up plan: 1 week in office with Dr. Griffin Basil Dispo: TBD. PT recommending: "Feel he can return to [facility] if can get rehab there, otherwise, may need SNF at d/c". He currently resides at [place].   ABLA: Hgb 7.4 today. Continue to monitor. Hemodynamically stable.   Noemi Chapel, PA-C 06/27/2020

## 2020-06-27 NOTE — Evaluation (Addendum)
Occupational Therapy Evaluation Patient Details Name: Paul Lowery MRN: [id number] DOB: 06-07-1951 Today's Date: 06/27/2020    History of Present Illness Paul Lowery is a 69 y.o. 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 Fib (off Eliquis in 2020), chronic diastolic CHF (LVEF 45 to 29% 2019), hypertension hyperlipidemia, COPD Gold stage II (FEV1/FVC 59% in 2019), with LTAC resident presented with fall and right hip pain.  Found to have Right hip comminuted intertrochanteric fracture, now s/p IM nail.   Clinical Impression   PTA pt staying at a LTACH, able to mobilize without AD and as needed assistance. At baseline pt has a peg/trach with PSMV. At time of eval, pt able to complete bed mobility at mod A (supine >sit) and max A +2 (sit > supine). Pt then sat EOB to apply lotion to UB/LB. He required mod A to fully manage LEs with lotion. He was able to maintain sitting EOB ~15 mins at supervision level. Pt then performed sit <> stand with heavy mod A +2 and RW. He took a few small side steps up to [facility] before needing to sit. Given current status, recommend SNF vs LTACH for pt to return for continued rehab. He states he wants to go home, he would need 24/7 supervision and [place] therapies. Will continue to follow per POC listed below.  Pt on 5L trach collar with VSS throughout session. Is min A in don/doff of PSMV    Follow Up Recommendations  SNF;Supervision/Assistance - 24 hour    Equipment Recommendations  3 in 1 bedside commode;Wheelchair (measurements OT);Wheelchair cushion (measurements OT)    Recommendations for Other Services       Precautions / Restrictions Precautions Precautions: Fall Precaution Comments: trach with PMSV/ PEG Restrictions Weight Bearing Restrictions: Yes RLE Weight Bearing: Weight bearing as tolerated      Mobility Bed Mobility Overal bed  mobility: Needs Assistance Bed Mobility: Supine to Sit;Sit to Supine     Supine to sit: Mod assist Sit to supine: +2 for physical assistance;Max assist   General bed mobility comments: assist to guide RLE to EOB and support at trunk to come up into sitting; max A +2 to return to bed with BLE support adn truncal guarding to return supine  Transfers Overall transfer level: Needs assistance Equipment used: Rolling walker (2 wheeled) Transfers: Sit to/from Stand Sit to Stand: Mod assist;+2 physical assistance;From elevated surface         General transfer comment: heavy mod A +2 to rise into full standing with increased time and cueing for appropriate posture. Pt able to take few small side step up to [facility] before needing to sit.    Balance Overall balance assessment: Needs assistance Sitting-balance support: Feet supported Sitting balance-Leahy Scale: Fair Sitting balance - Comments: EOB no support needed   Standing balance support: Bilateral upper extremity supported;During functional activity Standing balance-Leahy Scale: Poor Standing balance comment: heavy reliance on RW and +2 support from OT/PT                           ADL either performed or assessed with clinical judgement   ADL Overall ADL's : Needs assistance/impaired Eating/Feeding: Set up;Sitting Eating/Feeding Details (indicate cue type and reason): ice chips only Grooming: Set up;Sitting   Upper Body Bathing: Minimal assistance;Sitting   Lower Body Bathing: Moderate assistance;Sitting/lateral leans Lower Body Bathing Details (indicate cue  type and reason): simulated with application of lotion sitting EOB Upper Body Dressing : Set up;Sitting   Lower Body Dressing: Maximal assistance;Sit to/from stand   Toilet Transfer: +2 for physical assistance;+2 for safety/equipment;Maximal assistance;Stand-pivot;BSC;RW   Toileting- Clothing Manipulation and Hygiene: Maximal assistance;Sitting/lateral lean;Sit  to/from stand       Functional mobility during ADLs: Maximal assistance;+2 for physical assistance;+2 for safety/equipment;Rolling walker       Vision Patient Visual Report: No change from baseline       Perception     Praxis      Pertinent Vitals/Pain Pain Assessment: Faces Faces Pain Scale: Hurts little more Pain Location: R hip Pain Descriptors / Indicators: Discomfort;Grimacing;Operative site guarding Pain Intervention(s): Monitored during session;Limited activity within patient's tolerance;RN gave pain meds during session     Hand Dominance     Extremity/Trunk Assessment Upper Extremity Assessment Upper Extremity Assessment: Overall WFL for tasks assessed   Lower Extremity Assessment Lower Extremity Assessment: Defer to PT evaluation       Communication Communication Communication: Tracheostomy;Passy-Muir valve   Cognition Arousal/Alertness: Awake/alert Behavior During Therapy: WFL for tasks assessed/performed Overall Cognitive Status: No family/caregiver present to determine baseline cognitive functioning                                 General Comments: difficult to fully assess 2/2 compromised communication. Pt did have PSMV present but still spoke in low, quick tone. Did require repitition and increased time- suspect this could be due to him being [facility] and being difficult to understand   General Comments       Exercises     Shoulder Instructions      Home Living Family/patient expects to be discharged to:: Other (Comment) (LTACH)                                        Prior Functioning/Environment Level of Independence: Independent        Comments: Pt reports he was walking without device prior to admission; had assist from staff for ADL        OT Problem List: Decreased strength;Decreased knowledge of use of DME or AE;Decreased knowledge of precautions;Decreased activity tolerance;Cardiopulmonary status limiting  activity;Impaired balance (sitting and/or standing)      OT Treatment/Interventions: Self-care/ADL training;Therapeutic exercise;Patient/family education;Balance training;Energy conservation;Therapeutic activities;DME and/or AE instruction    OT Goals(Current goals can be found in the care plan section) Acute Rehab OT Goals Patient Stated Goal: to eventually go home OT Goal Formulation: With patient Time For Goal Achievement: 07/11/20 Potential to Achieve Goals: Good  OT Frequency: Min 2X/week   Barriers to D/C:            Co-evaluation              AM-PAC OT "6 Clicks" Daily Activity     Outcome Measure Help from another person eating meals?: A Little Help from another person taking care of personal grooming?: A Little Help from another person toileting, which includes using toliet, bedpan, or urinal?: A Lot Help from another person bathing (including washing, rinsing, drying)?: A Lot Help from another person to put on and taking off regular upper body clothing?: A Little Help from another person to put on and taking off regular lower body clothing?: A Lot 6 Click Score: 15   End of Session Equipment  Utilized During Treatment: Gait belt;Rolling walker;Oxygen Nurse Communication: Mobility status  Activity Tolerance: Patient tolerated treatment well Patient left: in bed;with call bell/phone within reach  OT Visit Diagnosis: Unsteadiness on feet (R26.81);Other abnormalities of gait and mobility (R26.89);Muscle weakness (generalized) (M62.81)                Time: 1431-1510 OT Time Calculation (min): 39 min Charges:  OT General Charges $OT Visit: 1 Visit OT Evaluation $OT Eval Moderate Complexity: 1 Mod OT Treatments $Self Care/Home Management : 8-22 mins  Zenovia Jarred, MSOT, OTR/L Acute Rehabilitation Services [facility] Office Number: [phone] Pager: [phone]  Zenovia Jarred 06/27/2020, 5:09 PM

## 2020-06-27 NOTE — Progress Notes (Signed)
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   Subjective:  Patient resting increase and his oxycodone to 10 mg dose which he takes at home.  When I informed him that he was already on the 10 mg dose he seems satisfied.  Is using as needed Dilaudid as needed.  Has no complaints.  Is happy to have his Passy-Muir valve back.  Objective: Vitals:   06/27/20 0355 06/27/20 0813 06/27/20 0848 06/27/20 1121  BP: 129/60  (!) 136/56   Pulse: 75 83 74 76  Resp: 16  18 18   Temp: 98.7 F (37.1 C)  98.7 F (37.1 C)   TempSrc: Oral  Oral   SpO2: 99% 98% 100% 97%  Weight:      Height:        Intake/Output Summary (Last 24 hours) at 06/27/2020 1548 Last data filed at 06/27/2020 0848 Gross per 24 hour  Intake 250 ml  Output 150 ml  Net 100 ml   Filed Weights   06/25/20 1603  Weight: 106.1 kg     Exam:  General: Sitting up in bed in no acute distress, talking comfortably through traffic Eyes: sclera anicteric, conjuctiva mild injection bilaterally CVS: S1-S2, regular  Respiratory: Coarse breath sounds bilaterally GI: NABS, soft, NT  LE: No edema.  Neuro: grossly nonfocal.    Assessment & Plan:   Hip fracture Tolerated right cephalomedullary nail placement yesterday Further management of pain and DVT prophylaxis per orthopedics PT OT as tolerated  Squamous cell CA of tonsils Per outpatient regimen  already in place  Tracheostomy Passy-Muir valve was replaced yesterday.  Dysphagia Patient is wanting to eat, SLP consulted for swallow evaluation  COPD No evidence for acute flare As needed inhaled bronchodilators ordered Continue Pulmicort  Atrial fibrillation Rate controlled on carvedilol Anticoagulation to be started as warranted  HTN Continue lisinopril, carvedilol   DVT prophylaxis: CAD Code Status: Full Family Communication: None Disposition Plan:   Patient is from: Home  Anticipated Discharge Location: SNF  Barriers to Discharge: Recent surgery  Is patient medically stable for Discharge: No   Consultants:  Orthopedic  Procedures:  Status post cephalic medullary nail placement yesterday  Antimicrobials:  None   Data Reviewed:  Basic Metabolic Panel: Recent Labs  Lab 06/24/20 1732 06/25/20 0608 06/26/20 0212 06/27/20 0607  NA 135 133* 136 135  K 4.9 3.4* 3.4* 3.6  CL 97* 99 99 98  CO2 30 30 27 28   GLUCOSE 102* 102* 146* 146*  BUN 21 16 18  29*  CREATININE 0.72 0.70 0.79 0.87  CALCIUM 9.1 9.1 8.7* 8.6*   Liver Function Tests: No results for input(s): AST, ALT, ALKPHOS, BILITOT, PROT, ALBUMIN in the last 168 hours. No results for input(s): LIPASE, AMYLASE in the last 168 hours. No results for input(s): AMMONIA in the last 168 hours. CBC: Recent Labs  Lab 06/24/20 1732 06/25/20 0608 06/26/20 0212 06/27/20 0607  WBC 5.2 4.8 7.0 6.6  NEUTROABS  3.9  --   --   --   HGB 9.0* 8.7* 8.1* 7.4*  HCT 29.2* 28.6* 26.8* 24.5*  MCV 80.0 79.4* 80.2 79.8*  PLT 253 185 174 163   Cardiac Enzymes: No results for input(s): CKTOTAL, CKMB, CKMBINDEX, TROPONINI in the last 168 hours. BNP (last 3 results) No results for input(s): PROBNP in the last 8760 hours. CBG: Recent Labs  Lab 06/26/20 2006 06/26/20 2331 06/27/20 0356 06/27/20 0820 06/27/20 1145  GLUCAP 118* 132* 111* 124* 131*    Recent Results (from the past 240 hour(s))  SARS  Coronavirus 2 by RT PCR (hospital order, performed in [facility] hospital lab) Nasopharyngeal Nasopharyngeal Swab     Status: None   Collection Time: 06/24/20  5:19 PM   Specimen: Nasopharyngeal Swab  Result Value Ref Range Status   SARS Coronavirus 2 NEGATIVE NEGATIVE Final    Comment: (NOTE) SARS-CoV-2 target nucleic acids are NOT DETECTED.  The SARS-CoV-2 RNA is generally detectable in upper and lower respiratory specimens during the acute phase of infection. The lowest concentration of SARS-CoV-2 viral copies this assay can detect is 250 copies / mL. A negative result does not preclude SARS-CoV-2 infection and should not be used as the sole basis for treatment or other patient management decisions.  A negative result may occur with improper specimen collection / handling, submission of specimen other than nasopharyngeal swab, presence of viral mutation(s) within the areas targeted by this assay, and inadequate number of viral copies (<250 copies / mL). A negative result must be combined with clinical observations, patient history, and epidemiological information.  Fact Sheet for Patients:   StrictlyIdeas.no  Fact Sheet for Healthcare Providers: BankingDealers.co.za  This test is not yet approved or  cleared by the [place] FDA and has been authorized for detection and/or diagnosis of SARS-CoV-2 by FDA under an Emergency Use Authorization (EUA).  This EUA will remain in effect (meaning this test can be used) for the duration of the COVID-19 declaration under Section 564(b)(1) of the Act, 21 U.S.C. section 360bbb-3(b)(1), unless the authorization is terminated or revoked sooner.  Performed at [facility], [place] [address], [place], [place] [address]   Surgical pcr screen     Status: None   Collection Time: 06/25/20 12:37 AM   Specimen: Nasal Mucosa; Nasal Swab  Result Value Ref Range Status   MRSA, PCR NEGATIVE  NEGATIVE Final   Staphylococcus aureus NEGATIVE NEGATIVE Final    Comment: (NOTE) The Xpert SA Assay (FDA approved for NASAL specimens in patients 43 years of age and older), is one component of a comprehensive surveillance program. It is not intended to diagnose infection nor to guide or monitor treatment. Performed at [facility], [place] [address], [place], [place] [address]       Studies: DG C-Arm 1-60 Min  Result Date: 06/25/2020 CLINICAL DATA:  ORIF right femoral fracture EXAM: DG C-ARM 1-60 MIN; RIGHT FEMUR 2 VIEWS FLUOROSCOPY TIME:  Fluoroscopy Time:  55 seconds Radiation Exposure Index (if provided by the fluoroscopic device): 8.61 mGy Number of Acquired Spot Images: 4 images COMPARISON:  06/24/2020 FINDINGS: 4 fluoroscopic images are obtained during the performance of the procedure and are provided for interpretation only. Intramedullary rod with dynamic screw and distal interlocking screw traverses a comminuted intertrochanteric right hip fracture. Alignment is near anatomic. IMPRESSION: 1. ORIF right hip fracture as above. Electronically Signed   By: Randa Ngo M.D.   On: 06/25/2020 19:31   DG Hip  Port Unilat With Pelvis 1V Right  Result Date: 06/25/2020 CLINICAL DATA:  69 year old male status post right femur ORIF. EXAM: DG HIP (WITH OR WITHOUT PELVIS) 1V PORT RIGHT COMPARISON:  Intraoperative images today. FINDINGS: Portable AP supine views at 1936 hours. Right femur intramedullary rod with proximal interlocking dynamic hip screw and distal interlocking cortical screw. Stable alignment about the intertrochanteric fracture, near anatomic. Chronic deformity of the visible left femur with intramedullary rod appears stable. No new osseous abnormality. IMPRESSION: Right femur ORIF today with no adverse features. Electronically Signed   By: Genevie Ann M.D.   On: 06/25/2020 20:13   DG FEMUR, MIN 2 VIEWS RIGHT  Result Date: 06/25/2020 CLINICAL DATA:  ORIF right femoral fracture  EXAM: DG C-ARM 1-60 MIN; RIGHT FEMUR 2 VIEWS FLUOROSCOPY TIME:  Fluoroscopy Time:  55 seconds Radiation Exposure Index (if provided by the fluoroscopic device): 8.61 mGy Number of Acquired Spot Images: 4 images COMPARISON:  06/24/2020 FINDINGS: 4 fluoroscopic images are obtained during the performance of the procedure and are provided for interpretation only. Intramedullary rod with dynamic screw and distal interlocking screw traverses a comminuted intertrochanteric right hip fracture. Alignment is near anatomic. IMPRESSION: 1. ORIF right hip fracture as above. Electronically Signed   By: Randa Ngo M.D.   On: 06/25/2020 19:31     Scheduled Meds:  atorvastatin  80 mg Oral Daily   budesonide (PULMICORT) nebulizer solution  0.25 mg Nebulization BID   carvedilol  25 mg Oral BID WC   celecoxib  100 mg Oral BID   docusate  100 mg Per Tube BID   enoxaparin (LOVENOX) injection  40 mg Subcutaneous Q24H   feeding supplement (PROSource TF)  90 mL Per Tube BID   free water  175 mL Per Tube Q4H   furosemide  80 mg Oral Daily   ipratropium-albuterol  3 mL Nebulization BID   lisinopril  5 mg Oral Daily   Continuous Infusions:  feeding supplement (OSMOLITE 1.5 CAL) 1,000 mL (06/26/20 0005)    Principal Problem:   Closed comminuted fracture of hip, right, initial encounter ([place]) Active Problems:   Chronic diastolic CHF (congestive heart failure) (HCC)   Status post tracheostomy ([place])   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

## 2020-06-27 NOTE — Progress Notes (Signed)
Physical Therapy Treatment Patient Details Name: Paul Lowery MRN: [id number] DOB: December 02, 1950 Today's Date: 06/27/2020    History of Present Illness Paul Lowery is a 69 y.o. 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 Fib (off Eliquis in 2020), chronic diastolic CHF (LVEF 45 to 40% 2019), hypertension hyperlipidemia, COPD Gold stage II (FEV1/FVC 59% in 2019), with LTAC resident presented with fall and right hip pain.  Found to have Right hip comminuted intertrochanteric fracture, now s/p IM nail.    PT Comments    Continuing work on functional mobility and activity tolerance;  Session focused on transfers, came in to assist OT with transfers and assisting pt back to bed; performed sit <> stand with heavy mod A +2 and RW. He took a few small side steps up to [facility] before needing to sit   Follow Up Recommendations  LTACH     Equipment Recommendations  Rolling walker with 5" wheels    Recommendations for Other Services       Precautions / Restrictions Precautions Precautions: Fall Precaution Comments: trach with PMSV/ PEG Restrictions RLE Weight Bearing: Weight bearing as tolerated    Mobility  Bed Mobility Overal bed mobility: Needs Assistance Bed Mobility: Sit to Supine     Supine to sit: Mod assist Sit to supine: +2 for physical assistance;Max assist   General bed mobility comments: Max A +2 to return to bed with BLE support adn truncal guarding to return supine  Transfers Overall transfer level: Needs assistance Equipment used: Rolling walker (2 wheeled) Transfers: Sit to/from Stand Sit to Stand: Mod assist;+2 physical assistance;From elevated surface         General transfer comment: heavy mod A +2 to rise into full standing with increased time and cueing for appropriate posture. Pt able to take few small side step up to [facility] before needing to  sit.  Ambulation/Gait                 Stairs             Wheelchair Mobility    Modified Rankin (Stroke Patients Only)       Balance Overall balance assessment: Needs assistance Sitting-balance support: Feet supported Sitting balance-Leahy Scale: Fair Sitting balance - Comments: EOB no support needed   Standing balance support: Bilateral upper extremity supported;During functional activity Standing balance-Leahy Scale: Poor Standing balance comment: heavy reliance on RW and +2 support from OT/PT                            Cognition Arousal/Alertness: Awake/alert Behavior During Therapy: WFL for tasks assessed/performed Overall Cognitive Status: No family/caregiver present to determine baseline cognitive functioning                                 General Comments: difficult to fully assess 2/2 compromised communication. Pt did have PSMV present but still spoke in low, quick tone. Did require repitition and increased time- suspect this could be due to him being [facility] and being difficult to understand      Exercises      General Comments        Pertinent Vitals/Pain Pain Assessment: Faces Faces Pain Scale: Hurts little more Pain Location: R hip Pain Descriptors / Indicators: Discomfort;Grimacing;Operative site guarding Pain Intervention(s): Monitored during session  Home Living                      Prior Function            PT Goals (current goals can now be found in the care plan section) Acute Rehab PT Goals Patient Stated Goal: to eventually go home PT Goal Formulation: With patient Time For Goal Achievement: 07/10/20 Potential to Achieve Goals: Good Progress towards PT goals: Progressing toward goals    Frequency    Min 3X/week      PT Plan Current plan remains appropriate    Co-evaluation              AM-PAC PT "6 Clicks" Mobility   Outcome Measure  Help needed turning from your back  to your side while in a flat bed without using bedrails?: A Lot Help needed moving from lying on your back to sitting on the side of a flat bed without using bedrails?: A Lot Help needed moving to and from a bed to a chair (including a wheelchair)?: A Lot Help needed standing up from a chair using your arms (e.g., wheelchair or bedside chair)?: A Lot Help needed to walk in hospital room?: Total Help needed climbing 3-5 steps with a railing? : Total 6 Click Score: 10    End of Session Equipment Utilized During Treatment: Gait belt Activity Tolerance: Patient limited by pain Patient left: in bed;with call bell/phone within reach;with bed alarm set Nurse Communication: Mobility status PT Visit Diagnosis: Other abnormalities of gait and mobility (R26.89);Difficulty in walking, not elsewhere classified (R26.2);Pain;History of falling (Z91.81) Pain - Right/Left: Right Pain - part of body: Hip     Time: 8416-6063 PT Time Calculation (min) (ACUTE ONLY): 10 min  Charges:  $Therapeutic Activity: 8-22 mins                     Roney Marion, PT  Acute Rehabilitation Services Pager [phone] Office [phone]    [place] 06/27/2020, 8:46 PM

## 2020-06-27 NOTE — Plan of Care (Signed)

## 2020-06-28 ENCOUNTER — Encounter (HOSPITAL_COMMUNITY): Payer: Self-pay | Admitting: Internal Medicine

## 2020-06-28 LAB — CBC
HCT: 23.8 % — ABNORMAL LOW (ref 39.0–52.0)
Hemoglobin: 7.4 g/dL — ABNORMAL LOW (ref 13.0–17.0)
MCH: 24.7 pg — ABNORMAL LOW (ref 26.0–34.0)
MCHC: 31.1 g/dL (ref 30.0–36.0)
MCV: 79.6 fL — ABNORMAL LOW (ref 80.0–100.0)
Platelets: 189 10*3/uL (ref 150–400)
RBC: 2.99 MIL/uL — ABNORMAL LOW (ref 4.22–5.81)
RDW: 16.5 % — ABNORMAL HIGH (ref 11.5–15.5)
WBC: 6.7 10*3/uL (ref 4.0–10.5)
nRBC: 0 % (ref 0.0–0.2)

## 2020-06-28 LAB — GLUCOSE, CAPILLARY
Glucose-Capillary: 107 mg/dL — ABNORMAL HIGH (ref 70–99)
Glucose-Capillary: 120 mg/dL — ABNORMAL HIGH (ref 70–99)
Glucose-Capillary: 121 mg/dL — ABNORMAL HIGH (ref 70–99)
Glucose-Capillary: 124 mg/dL — ABNORMAL HIGH (ref 70–99)
Glucose-Capillary: 134 mg/dL — ABNORMAL HIGH (ref 70–99)

## 2020-06-28 LAB — SARS CORONAVIRUS 2 BY RT PCR (HOSPITAL ORDER, PERFORMED IN ~~LOC~~ HOSPITAL LAB): SARS Coronavirus 2: NEGATIVE

## 2020-06-28 MED ORDER — FREE WATER: 175.0000 mL | 0 refills | Status: AC

## 2020-06-28 MED ORDER — POLYETHYLENE GLYCOL 3350 17 G PO PACK: 17.0000 g | PACK | Freq: Every day | ORAL | 0 refills | Status: AC | PRN

## 2020-06-28 MED ORDER — OXYCODONE HCL 5 MG PO TABS
5.0000 mg | ORAL_TABLET | ORAL | Status: DC | PRN
  Administered 2020-06-28: 5 mg via ORAL
  Filled 2020-06-28: qty 1

## 2020-06-28 MED ORDER — OXYCODONE HCL 5 MG PO TABS
10.0000 mg | ORAL_TABLET | Freq: Four times a day (QID) | ORAL | Status: DC | PRN
  Administered 2020-06-28: 10 mg via ORAL
  Filled 2020-06-28: qty 2

## 2020-06-28 MED ORDER — ENOXAPARIN SODIUM 40 MG/0.4ML ~~LOC~~ SOLN: 40.0000 mg | SUBCUTANEOUS | 0 refills | Status: DC

## 2020-06-28 MED ORDER — ORAL CARE MOUTH RINSE
15.0000 mL | Freq: Two times a day (BID) | OROMUCOSAL | Status: DC
  Administered 2020-06-28: 15 mL via OROMUCOSAL

## 2020-06-28 MED ORDER — OXYCODONE HCL 5 MG PO TABS: ORAL_TABLET | ORAL | 0 refills | Status: DC

## 2020-06-28 MED ORDER — CHLORHEXIDINE GLUCONATE 0.12 % MT SOLN: 15.0000 mL | Freq: Two times a day (BID) | OROMUCOSAL | Status: DC

## 2020-06-28 MED ORDER — OXYCODONE HCL 5 MG PO TABS: ORAL_TABLET | ORAL | 0 refills | Status: AC

## 2020-06-28 NOTE — Addendum Note (Signed)
Addendum  created 06/28/20 2246 by Nolon Nations, MD   Intraprocedure Staff edited

## 2020-06-28 NOTE — TOC Initial Note (Addendum)
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 From [facility]/ LT. Per pt plan is to return to [facility] @ D/C.      - s/p Right cephalomedullary nail (R intertrochanteric femur fracture), 9/17   Patient expressed understanding of PT recommendation and is agreeable to return to SNF  at time of discharge. NCM discussed insurance authorization process and provided Medicare SNF ratings list. Patient expressed being hopeful for rehab and to feel better soon. No further questions reported at this time. NCM to continue to follow and assist with discharge planning needs.   Expected Discharge Plan: [place]     Patient Goals and CMS Choice        Expected Discharge Plan and Services Expected Discharge Plan: [place]                                              Prior Living Arrangements/Services                       Activities of Daily Living Home Assistive Devices/Equipment: None ADL Screening (condition at time of admission) Patient's cognitive ability adequate to safely complete daily activities?: Yes Is the patient deaf or have difficulty hearing?: No Does the patient have difficulty seeing, even when wearing glasses/contacts?: No Does the patient have difficulty concentrating, remembering, or making decisions?: No Patient able to express need for assistance with ADLs?: Yes Does the patient have difficulty dressing or bathing?: Yes Independently performs ADLs?: No Communication: Independent with  device (comment) Dressing (OT): Needs assistance Is this a change from baseline?: Pre-admission baseline Grooming: Needs assistance Is this a change from baseline?: Pre-admission baseline Feeding: Independent Bathing: Needs assistance Is this a change from baseline?: Pre-admission baseline Toileting: Independent with device (comment) In/Out Bed: Needs assistance Is this a change from baseline?: Pre-admission baseline Walks in Home: Needs assistance Is this a change from baseline?: Pre-admission baseline Does the patient have difficulty walking or climbing stairs?: Yes Weakness of Legs: Right Weakness of Arms/Hands: None  Permission Sought/Granted                  Emotional Assessment              Admission diagnosis:  CHF (congestive heart failure) ([place]) [I50.9] Hip fracture ([place]) [S72.009A] Closed fracture of right hip, initial encounter ([place]) [S72.001A] Patient Active Problem List   Diagnosis Date Noted  . Chronic diastolic CHF (congestive heart failure) ([place]) 06/25/2020  . Status post tracheostomy ([place]) 06/25/2020  . Squamous cell carcinoma of tonsillar pillar ([place]) 06/25/2020  . COPD (chronic obstructive pulmonary disease) ([place]) 06/25/2020  . Essential hypertension 06/25/2020  . Atrial fibrillation, chronic ([place]) 06/25/2020  . Hyperlipidemia 06/25/2020  . Malnutrition of moderate degree 06/25/2020  . Closed comminuted fracture of hip, right, initial encounter ([place]) 06/24/2020   PCP:  Benito Mccreedy, MD Pharmacy:  No Pharmacies  Listed    Social Determinants of Health (SDOH) Interventions    Readmission Risk Interventions No flowsheet data found.

## 2020-06-28 NOTE — Progress Notes (Signed)
Report given to [facility]. All questions answered. Pt belongings gathered to be sent with him. PTAR to transport.

## 2020-06-28 NOTE — Progress Notes (Signed)
  Speech Language Pathology Treatment: Dysphagia;Passy Muir Speaking valve  Patient Details Name: Paul Lowery MRN: [id number] DOB: 1950/12/28 Today's Date: 06/28/2020 Time: 2694-8546 SLP Time Calculation (min) (ACUTE ONLY): 28 min  Assessment / Plan / Recommendation Clinical Impression  Pt was seen for skilled ST targeting PMV tolerance and PO trials.  Attempted to call [facility] regarding dysphagia and previous diet recommendation but was only able to reach their voicemail.  Pt was encountered awake/alert with PMV in place.  RN reported that he had been tolerating it well throughout the day.  Vocal quality was noted to be hoarse and breathy with reduced vocal intensity, but pt reported that this is his baseline.  He tolerated the PMV for 20 minutes without difficulty.  He additionally demonstrated how to don and doff the PMV independently.  Recommend that he wear the PMV during all therapies and with nursing staff given full supervision.    Pt additionally was seen with minimal trials of thin liquid and puree with PMV donned.  He consumed thin liquid via cup sip x1 and puree via tsp x2 without clinical s/sx of aspiration or difficulty including cough, wet vocal quality, etc.  He refused additional trials.  Plan for a MBS tomorrow to further evaluate swallow function and to determine if he is able to safely initiate a PO diet.  SLP will f/u per POC.     HPI HPI: Paul Lowery is a 69 y.o. 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 Fib (off Eliquis in 2020), chronic diastolic CHF (LVEF 45 to 27% 2019), hypertension hyperlipidemia, COPD Gold stage II (FEV1/FVC 59% in 2019), with LTAC resident presented with fall and right hip pain.  Found to have Right hip comminuted intertrochanteric fracture, now s/p IM nail.      SLP Plan  Continue with current plan of care       Recommendations   Diet recommendations: NPO Medication Administration: Via alternative means      Patient may use Passy-Muir Speech Valve: During all therapies with supervision PMSV Supervision: Full         Oral Care Recommendations: Oral care QID Follow up Recommendations: LTACH;Skilled Nursing facility SLP Visit Diagnosis: Aphonia (R49.1);Dysphagia, unspecified (R13.10) Plan: Continue with current plan of care       Hollis., M.S., [place] Office: [phone]  [place] 06/28/2020, 11:17 AM

## 2020-06-28 NOTE — NC FL2 (Signed)
[place] LEVEL OF CARE SCREENING TOOL     IDENTIFICATION  Patient Name: Paul Lowery Birthdate: 1950/10/30 Sex: male Admission Date (Current Location): 06/24/2020  [facility] and [place] Number:  Herbalist and Address:  [facility] [place], [place] [address], [place], [place] [address]      Provider Number: [id number]  Attending Physician Name and Address:  Oren Binet*  Relative Name and Phone Number:       Current Level of Care: Hospital Recommended Level of Care: [place] Prior Approval Number:    Date Approved/Denied:   PASRR Number: [id number]  Discharge Plan: SNF    Current Diagnoses: Patient Active Problem List   Diagnosis Date Noted  . Chronic diastolic CHF (congestive heart failure) ([place]) 06/25/2020  . Status post tracheostomy ([place]) 06/25/2020  . Squamous cell carcinoma of tonsillar pillar ([place]) 06/25/2020  . COPD (chronic obstructive pulmonary disease) ([place]) 06/25/2020  . Essential hypertension 06/25/2020  . Atrial fibrillation, chronic ([place]) 06/25/2020  . Hyperlipidemia 06/25/2020  . Malnutrition of moderate degree 06/25/2020  . Closed comminuted fracture of hip, right, initial encounter ([place]) 06/24/2020    Orientation RESPIRATION BLADDER Height & Weight     Self, Time, Situation, Place  Tracheostomy Continent Weight: 106.1 kg (from 05/18/20 encounter) Height:  6\' 3"  (190.5 cm) (from 06/16/20)  BEHAVIORAL SYMPTOMS/MOOD NEUROLOGICAL BOWEL NUTRITION STATUS      Continent Diet (refer to d/c summary)  AMBULATORY STATUS COMMUNICATION OF NEEDS Skin   Extensive Assist Verbally Surgical wounds (R hip comminuted intertrochanteric fracture,  s/p IM nail)                       Personal Care Assistance Level of Assistance  Bathing, Feeding, Dressing Bathing Assistance: Maximum assistance Feeding assistance: Maximum assistance Dressing Assistance: Maximum assistance     Functional  Limitations Info  Sight, Hearing, Speech Sight Info: Adequate Hearing Info: Adequate Speech Info: Adequate    SPECIAL CARE FACTORS FREQUENCY  PT (By licensed PT), OT (By licensed OT), Speech therapy     PT Frequency: 5x/week, evaluate and treat OT Frequency: 5x/week, evaluate and treat     Speech Therapy Frequency: 2x/ week, evaluate and treat      Contractures Contractures Info: Not present    Additional Factors Info  Allergies, Code Status Code Status Info: full code Allergies Info: NKDA           Current Medications (06/28/2020):  This is the current hospital active medication list Current Facility-Administered Medications  Medication Dose Route Frequency Provider Last Rate Last Admin  . acetylcysteine (MUCOMYST) 20 % nebulizer / oral solution 4 mL  4 mL Nebulization PRN McBane, Caroline N, PA-C      . albuterol (PROVENTIL) (2.5 MG/3ML) 0.083% nebulizer solution 2.5 mg  2.5 mg Nebulization Q4H PRN McBane, Maylene Roes, PA-C      . atorvastatin (LIPITOR) tablet 80 mg  80 mg Oral Daily Ethelda Chick, PA-C   80 mg at 06/28/20 0920  . bisacodyl (DULCOLAX) EC tablet 5 mg  5 mg Oral Daily PRN McBane, Maylene Roes, PA-C      . budesonide (PULMICORT) nebulizer solution 0.25 mg  0.25 mg Nebulization BID Ethelda Chick, PA-C   0.25 mg at 06/28/20 0825  . carvedilol (COREG) tablet 25 mg  25 mg Oral BID WC McBane, Caroline N, PA-C   25 mg at 06/28/20 0920  . celecoxib (CELEBREX) capsule 100 mg  100 mg  Oral BID Ethelda Chick, PA-C   100 mg at 06/28/20 0920  . chlorhexidine (PERIDEX) 0.12 % solution 15 mL  15 mL Mouth Rinse BID Bonnell Public Tublu, MD      . docusate (COLACE) 50 MG/5ML liquid 100 mg  100 mg Per Tube BID Annita Brod, MD   100 mg at 06/28/20 0921  . enoxaparin (LOVENOX) injection 40 mg  40 mg Subcutaneous Q24H Ethelda Chick, PA-C   40 mg at 06/28/20 1884  . feeding supplement (OSMOLITE 1.5 CAL) liquid 1,000 mL  1,000 mL Per Tube Continuous  Annita Brod, MD 55 mL/hr at 06/26/20 0005 1,000 mL at 06/26/20 0005  . feeding supplement (PROSource TF) liquid 90 mL  90 mL Per Tube BID Annita Brod, MD   90 mL at 06/28/20 0921  . free water 175 mL  175 mL Per Tube Q4H Annita Brod, MD   175 mL at 06/28/20 0800  . furosemide (LASIX) tablet 80 mg  80 mg Oral Daily Ethelda Chick, PA-C   80 mg at 06/28/20 0920  . hydrALAZINE (APRESOLINE) injection 5 mg  5 mg Intravenous Q6H PRN McBane, Caroline N, PA-C      . ipratropium-albuterol (DUONEB) 0.5-2.5 (3) MG/3ML nebulizer solution 3 mL  3 mL Nebulization BID McBane, Caroline N, PA-C   3 mL at 06/28/20 0825  . lisinopril (ZESTRIL) tablet 5 mg  5 mg Oral Daily Ethelda Chick, PA-C   5 mg at 06/28/20 0920  . MEDLINE mouth rinse  15 mL Mouth Rinse q12n4p Bonnell Public Tublu, MD      . menthol-cetylpyridinium (CEPACOL) lozenge 3 mg  1 lozenge Oral PRN McBane, Maylene Roes, PA-C       Or  . phenol (CHLORASEPTIC) mouth spray 1 spray  1 spray Mouth/Throat PRN McBane, Maylene Roes, PA-C      . metoCLOPramide (REGLAN) tablet 5-10 mg  5-10 mg Oral Q8H PRN McBane, Maylene Roes, PA-C       Or  . metoCLOPramide (REGLAN) injection 5-10 mg  5-10 mg Intravenous Q8H PRN McBane, Caroline N, PA-C      . ondansetron (ZOFRAN) tablet 4 mg  4 mg Oral Q6H PRN McBane, Maylene Roes, PA-C       Or  . ondansetron (ZOFRAN) injection 4 mg  4 mg Intravenous Q6H PRN McBane, Maylene Roes, PA-C      . oxyCODONE (Oxy IR/ROXICODONE) immediate release tablet 10 mg  10 mg Oral Q6H PRN McBane, Caroline N, PA-C      . oxyCODONE (Oxy IR/ROXICODONE) immediate release tablet 5 mg  5 mg Oral Q4H PRN McBane, Maylene Roes, PA-C      . polyethylene glycol (MIRALAX / GLYCOLAX) packet 17 g  17 g Oral Daily PRN Ethelda Chick, PA-C         Discharge Medications: Please see discharge summary for a list of discharge medications.  Relevant Imaging Results:  Relevant Lab Results:   Additional Information ss#  [id number], pt with trach and peg tube  Sharin Mons, RN

## 2020-06-28 NOTE — Progress Notes (Signed)
Physical Therapy Treatment Patient Details Name: Paul Lowery MRN: [id number] DOB: Dec 21, 1950 Today's Date: 06/28/2020    History of Present Illness 69 y.o. 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 Fib (off Eliquis in 2020), chronic diastolic CHF (LVEF 45 to 35% 2019), hypertension hyperlipidemia, COPD Gold stage II (FEV1/FVC 59% in 2019), with LTAC resident presented with fall and right hip pain.  Found to have Right hip comminuted intertrochanteric fracture, now s/p IM nail.    PT Comments    Pt was seen for mobility on RW and attempted to walk with him. Pt had fluctuations of O2 sats durign the session, and with PMV was dropping to 83%.  Removed it, but then asked him to put aerated moist air from his line over the trach.  He did not get the reason why, became angry and nursing attempted to calm him down.  Finally allowed the air to remain on his trach, and required three people to get him back to bed with a quick pivot and scoot up on the bed.  Pt is leaving shortly for LTACH.     Follow Up Recommendations  LTACH     Equipment Recommendations  Rolling walker with 5" wheels    Recommendations for Other Services       Precautions / Restrictions Precautions Precautions: Fall Precaution Comments: trach with PMSV/ PEG Restrictions Weight Bearing Restrictions: Yes RLE Weight Bearing: Weight bearing as tolerated    Mobility  Bed Mobility Overal bed mobility: Needs Assistance Bed Mobility: Supine to Sit;Sit to Supine     Supine to sit: Mod assist Sit to supine: +2 for physical assistance;+2 for safety/equipment;Max assist   General bed mobility comments: pt was quite agitated and required some discussion to get back to bed  Transfers Overall transfer level: Needs assistance Equipment used: Rolling walker (2 wheeled) Transfers: Sit to/from Stand Sit to Stand:  +2 physical assistance;+2 safety/equipment;Mod assist         General transfer comment: 2 mod with dense cues to stand, could do partial lifting to help scoot up bed  Ambulation/Gait             General Gait Details: pt could not take a step once up on walker   Stairs             Wheelchair Mobility    Modified Rankin (Stroke Patients Only)       Balance Overall balance assessment: Needs assistance;History of Falls Sitting-balance support: Feet supported Sitting balance-Leahy Scale: Fair     Standing balance support: Bilateral upper extremity supported;During functional activity Standing balance-Leahy Scale: Poor                              Cognition Arousal/Alertness: Awake/alert Behavior During Therapy: Impulsive;Agitated Overall Cognitive Status: No family/caregiver present to determine baseline cognitive functioning                                 General Comments: pt was agitated about hurting and confusion of when meds are due      Exercises      General Comments General comments (skin integrity, edema, etc.): pt became so agitated about meds and time of delivery that he was not following instructions well      Pertinent Vitals/Pain Pain Assessment:  Faces Faces Pain Scale: Hurts whole lot Pain Location: R hip Pain Descriptors / Indicators: Discomfort;Grimacing;Operative site guarding Pain Intervention(s): Limited activity within patient's tolerance;Monitored during session;Premedicated before session;Repositioned    Home Living                      Prior Function            PT Goals (current goals can now be found in the care plan section) Acute Rehab PT Goals Patient Stated Goal: to get home    Frequency    Min 3X/week      PT Plan Current plan remains appropriate    Co-evaluation              AM-PAC PT "6 Clicks" Mobility   Outcome Measure  Help needed turning from your back to  your side while in a flat bed without using bedrails?: A Lot Help needed moving from lying on your back to sitting on the side of a flat bed without using bedrails?: A Lot Help needed moving to and from a bed to a chair (including a wheelchair)?: A Lot Help needed standing up from a chair using your arms (e.g., wheelchair or bedside chair)?: A Lot Help needed to walk in hospital room?: Total Help needed climbing 3-5 steps with a railing? : Total 6 Click Score: 10    End of Session Equipment Utilized During Treatment: Gait belt Activity Tolerance: Patient limited by pain Patient left: in bed;with call bell/phone within reach;with bed alarm set Nurse Communication: Mobility status PT Visit Diagnosis: Other abnormalities of gait and mobility (R26.89);Difficulty in walking, not elsewhere classified (R26.2);Pain;History of falling (Z91.81) Pain - Right/Left: Right Pain - part of body: Hip     Time: 6659-9357 PT Time Calculation (min) (ACUTE ONLY): 41 min  Charges:  $Therapeutic Activity: 23-37 mins $Neuromuscular Re-education: 8-22 mins                    Ramond Dial 06/28/2020, 6:48 PM  Mee Hives, PT MS Acute Rehab Dept. Number: [place] and [place]

## 2020-06-28 NOTE — Progress Notes (Signed)
   ORTHOPAEDIC PROGRESS NOTE  s/p Procedure(s): INTRAMEDULLARY (IM) NAIL INTERTROCHANTRIC on 06/25/2020 with Dr. Griffin Basil  SUBJECTIVE: Reports some pain about operative site, but saying this is improving. No other complaints.  OBJECTIVE: PE: General: alert, no acute distress RLE: dressings CDI, intact EHL/TA/GSC, endorses distal sensation, warm well perfused foot   Vitals:   06/28/20 0803 06/28/20 0825  BP: (!) 146/65   Pulse: 77 78  Resp: 18 18  Temp: 98.6 F (37 C)   SpO2: 97% 96%    ASSESSMENT: Paul Lowery is a 69 y.o. male status post above. POD#3  PLAN: Weightbearing: WBAT RLE Insicional and dressing care: Reinforce dressings as needed Orthopedic device(s): None Showering: Post-op day #2 with assistance VTE prophylaxis: Lovenox 40mg  qd  Pain control: PRN pain medications, preferring oral medications. Begin to wean off pain medications. Per chart review, it looks like he was on oxycodone 10 mg daily prior to surgery at the facility.  Follow - up plan: 1 week in office with Dr. Griffin Basil Dispo: TBD. PT recommending: LTACH.  He currently resides at [place].   ABLA: Hgb 7.4 again today. Continue to monitor. Hemodynamically stable.   Prescriptions for pain medication and DVT prophylaxis printed and placed in the patient's chart. Once cleared by medicine and therapies, patient okay for discharge from orthopedic standpoint back to [facility] - .   Noemi Chapel, PA-C 06/28/2020

## 2020-06-28 NOTE — TOC Transition Note (Signed)
Transition of Care [facility]) - CM/SW Discharge Note   Patient Details  Name: Paul Lowery MRN: [id number] Date of Birth: 1951/01/09  Transition of Care [facility]) CM/SW Contact:  Sharin Mons, RN Phone Number: 06/28/2020, 3:48 PM   Clinical Narrative:    Patient will DC to: SNF, [facility] Anticipated DC date: 06/28/2020 Family notified:daughter Transport by: Corey Harold  Per MD patient ready for DC today. RN, patient, patient's family, and facility notified of DC Discharge Summary and FL2 sent to facility. RN to call report prior to discharge [phone]). RM # [id number]. DC packet on chart. Ambulance transport requested for patient.  Rebecca Eaton (Daughter)     [phone]       RNCM will sign off for now as intervention is no longer needed. Please consult [place] again if new needs arise.   Final next level of care: [place] ([facility]) Barriers to Discharge: No Barriers Identified   Patient Goals and CMS Choice   CMS Medicare.gov Compare Post Acute Care list provided to:: Patient    Discharge Placement   Discharge Plan and Services     Social Determinants of Health (SDOH) Interventions     Readmission Risk Interventions No flowsheet data found.

## 2020-06-28 NOTE — TOC CAGE-AID Note (Signed)
Transition of Care [facility]) - CAGE-AID Screening   Patient Details  Name: Paul Lowery MRN: [id number] Date of Birth: 1951/02/28  Transition of Care [facility]) CM/SW Contact:    Emeterio Reeve, [place] Phone Number: 06/28/2020, 3:56 PM   Clinical Narrative:  CSW met with pt at bedside. CSW introduced self and explained her role at the hospital.  PT denies alcohol use and substance use. Pt did not need any resources at this time.    CAGE-AID Screening:    Have You Ever Felt You Ought to Cut Down on Your Drinking or Drug Use?: No Have People Annoyed You By Critizing Your Drinking Or Drug Use?: No Have You Felt Bad Or Guilty About Your Drinking Or Drug Use?: No Have You Ever Had a Drink or Used Drugs First Thing In The Morning to Steady Your Nerves or to Get Rid of a Hangover?: No CAGE-AID Score: 0  Substance Abuse Education Offered: Yes     Blima Ledger, [place] Social Worker [phone]

## 2020-06-28 NOTE — Plan of Care (Signed)

## 2020-06-28 NOTE — Discharge Summary (Addendum)
Paul Lowery HYQ:[id number] DOB: 12/29/1950 DOA: 06/24/2020  PCP: Benito Mccreedy, MD  Admit date: 06/24/2020  Discharge date: 06/28/2020  Admitted From: SNF   disposition: SNF   Recommendations for Outpatient Follow-up:   Follow up with orthopedics as recommended by them.   Home Health: Patient going to SNF Equipment/Devices: Per SNF Consultations: Orthopedics Discharge Condition: Improved CODE STATUS: Full Diet Recommendation: Heart Healthy   Diet Order            Diet - low sodium heart healthy                  Chief Complaint  Patient presents with  . 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                                                                  Hospital Course   Patient underwent an intramedullary intertrochanteric surgical fixation on 06/25/2020.  He did well in the postop and participated actively with physical therapy and Occupational Therapy.  He was seen by speech therapy as his Passy-Muir valve had been was placed in the OR and the Passy-Muir valve was replaced.  Patient has done well and has been maintained on his tube feeds and free water as per prior to admission.  His vital signs and electrolytes have remained stable and normal.  Patient is now discharged  back to his SNF for ongoing PT and OT with no change in his baseline medications.  Hip fracture Tolerated right cephalomedullary nail placement 06/25/2020 Oxycodone for pain management per orthopedics Patient placed on full dose Lovenox per orthopedics PT OT as tolerated  Atrial fibrillation Rate controlled on carvedilol Patient was started back on full dose enoxaparin per orthopedics  Squamous cell CA of tonsils Per outpatient regimen already in place  Tracheostomy Passy-Muir valve was replaced yesterday.  Dysphagia Patient is wanting to eat, SLP was consulted and kept him on his tube feeds as per previous. Patient remains n.p.o.  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)   COPD (chronic obstructive pulmonary disease) (HCC)   Essential hypertension   Atrial fibrillation, chronic (HCC)   Hyperlipidemia   Malnutrition of moderate degree    Discharge instructions    Discharge Instructions    Diet - low sodium heart healthy   Complete by: As directed    Discharge wound care:   Complete by: As directed    As per orthopedics instruction   Increase activity slowly   Complete by: As directed       Discharge Medications   Allergies as of 06/28/2020   No Known Allergies     Medication List    TAKE these medications   acetaminophen 325 MG tablet Commonly known as: TYLENOL Take 975 mg by mouth every 8 (eight) hours.   atorvastatin 80 MG tablet Commonly known as: LIPITOR Take 80 mg by mouth daily.   budesonide 0.5 MG/2ML nebulizer solution Commonly known as: PULMICORT Take 0.5 mg by  nebulization every 12 (twelve) hours.   carvedilol 25 MG tablet Commonly known as: COREG Take 25 mg by mouth every 12 (twelve) hours.   enoxaparin 40 MG/0.4ML injection Commonly known as: LOVENOX Inject 0.4 mLs (40 mg total) into the skin daily. For DVT prophylaxis after surgery   feeding supplement (JEVITY 1.5 CAL) Liqd Place 250 mLs into feeding tube every 4 (four) hours.   fentaNYL 25 MCG/HR Commonly known as: [place] 1 patch onto the skin every 3 (three) days.   fluticasone 50 MCG/ACT nasal spray Commonly known as: FLONASE Place 1 spray into both nostrils every 12 (twelve) hours as needed for allergies or rhinitis.   free water Soln Place 175 mLs into feeding tube every 4 (four) hours.   furosemide 80 MG tablet Commonly known as: LASIX Take 80 mg by mouth.   ipratropium-albuterol 0.5-2.5 (3) MG/3ML Soln Commonly known as: DUONEB Take 3 mLs by nebulization every 6 (six) hours.   lisinopril 5 MG tablet Commonly known as: ZESTRIL Take 5 mg by mouth daily.   LORazepam 0.5 MG tablet Commonly known as: ATIVAN Take 0.5 mg by mouth every 6 (six) hours as needed for anxiety.   melatonin 3 MG Tabs tablet Take 3 mg by mouth at bedtime.   oxyCODONE 5 MG immediate release tablet Commonly known as: Oxy IR/ROXICODONE Take 1 pills every 6 hrs as needed for pain What changed:   how much to take  how to take this  when to take this  reasons to take this  additional instructions   polyethylene glycol 17 g packet Commonly known as: MIRALAX / GLYCOLAX Take 17 g by mouth daily as needed for mild constipation.   senna-docusate 8.6-50 MG tablet Commonly known as: Senokot-S Take 1 tablet by mouth daily.   sodium chloride 0.65 % Soln nasal spray Commonly known as: OCEAN Place 1 spray into both nostrils as needed for congestion.            Discharge Care Instructions  (From admission, onward)         Start     Ordered   06/28/20 0000  Discharge wound  care:       Comments: As per orthopedics instruction   06/28/20 1431           Follow-up Information    Hiram Gash, MD. Schedule an appointment as soon as possible for a visit in 1 week.   Specialty:  Orthopedic Surgery Why: For wound re-check and xray Contact information: [address] [place] [address] [place] [address] [phone]               Major procedures and Radiology Reports - PLEASE review detailed and final reports thoroughly  -      DG Chest 1 View  Result Date: 06/24/2020 CLINICAL DATA:  Fall, hip fracture EXAM: CHEST  1 VIEW COMPARISON:  Radiograph 04/15/2018 FINDINGS: Endotracheal tube terminates in the mid trachea, approximately 5.5 cm from the level of the carina. There are postsurgical changes from prior sternotomy and CABG including reinforced inferior sternal sutures. Telemetry leads overlie the chest. Cardiomegaly similar to comparison exams. Aorta is calcified. There are diffuse hazy opacities throughout the lungs with a slight basilar gradient as well as mild cephalization and indistinctness of the pulmonary vascularity. Suspect some mild septal thickening seen in the periphery. No pneumothorax or visible effusion. No focal consolidation. No acute osseous or soft tissue abnormality. Degenerative changes are present in the imaged spine and shoulders. IMPRESSION: 1. Endotracheal tube terminates in the mid trachea, approximately 5.5 cm from the level of the carina. 2. Findings could suggest CHF with mild pulmonary edema and stable cardiomegaly. 3. No visible pleural effusion, pneumothorax or focal consolidative process. 4. Prior sternotomy and CABG. 5.  Aortic Atherosclerosis (ICD10-I70.0). Electronically Signed   By: Lovena Le M.D.   On: 06/24/2020 17:01   DG Chest Port 1 View  Result Date: 06/25/2020 CLINICAL DATA:  CHF EXAM: PORTABLE CHEST 1 VIEW COMPARISON:  06/24/2020 FINDINGS: Cardiomegaly. Mild vascular congestion. Prior CABG. No overt edema or  confluent opacity. No effusion. No acute bony abnormality. IMPRESSION: Cardiomegaly.  Mild vascular congestion. Electronically Signed   By: Rolm Baptise M.D.   On: 06/25/2020 06:06   DG C-Arm 1-60 Min  Result Date: 06/25/2020 CLINICAL DATA:  ORIF right femoral fracture EXAM: DG C-ARM 1-60 MIN; RIGHT FEMUR 2 VIEWS FLUOROSCOPY TIME:  Fluoroscopy Time:  55 seconds Radiation Exposure Index (if provided by the fluoroscopic device): 8.61 mGy Number of Acquired Spot Images: 4 images COMPARISON:  06/24/2020 FINDINGS: 4 fluoroscopic images are obtained during the performance of the procedure and are provided for interpretation only. Intramedullary rod with dynamic screw and distal interlocking screw traverses a comminuted intertrochanteric right hip fracture. Alignment is near anatomic. IMPRESSION: 1. ORIF right hip fracture as above. Electronically Signed   By: Randa Ngo M.D.   On: 06/25/2020 19:31   ECHOCARDIOGRAM COMPLETE  Result Date: 06/25/2020    ECHOCARDIOGRAM REPORT   Patient Name:   Paul Lowery Date of Exam: 06/25/2020 Medical Rec #:  [id number]      Height:       75.0 in Accession #:    [id number]     Weight:       234.0 lb Date of Birth:  17-Oct-1950      BSA:          2.346 m Patient Age:    75 years       BP:           157/83 mmHg Patient Gender: M              HR:           79 bpm. Exam Location:  Inpatient Procedure: 2D Echo, Cardiac Doppler and Color Doppler Indications:    Pre-op evaluation  History:        Patient has no prior history of Echocardiogram examinations.  CHF, CAD, Prior CABG, COPD, Arrythmias:Atrial Fibrillation; Risk                 Factors:Hypertension and Dyslipidemia. Right hip fracture.  Sonographer:    Dustin Flock Referring Phys: [id number]  1. Left ventricular ejection fraction, by estimation, is 55 to 60%. The left ventricle has normal function. The left ventricle has no regional wall motion abnormalities. There is moderate  concentric left ventricular hypertrophy. Left ventricular diastolic function could not be evaluated.  2. Right ventricular systolic function is normal. The right ventricular size is mildly enlarged. There is normal pulmonary artery systolic pressure. The estimated right ventricular systolic pressure is 54.6 mmHg.  3. Left atrial size was severely dilated.  4. Right atrial size was moderately dilated.  5. The mitral valve is grossly normal. Mild to moderate mitral valve regurgitation. No evidence of mitral stenosis.  6. The tricuspid valve is abnormal. Tricuspid valve regurgitation is moderate.  7. The aortic valve is tricuspid. There is mild calcification of the aortic valve. Aortic valve regurgitation is not visualized. Mild aortic valve sclerosis is present, with no evidence of aortic valve stenosis.  8. The inferior vena cava is normal in size with <50% respiratory variability, suggesting right atrial pressure of 8 mmHg. FINDINGS  Left Ventricle: Left ventricular ejection fraction, by estimation, is 55 to 60%. The left ventricle has normal function. The left ventricle has no regional wall motion abnormalities. The left ventricular internal cavity size was normal in size. There is  moderate concentric left ventricular hypertrophy. Abnormal (paradoxical) septal motion consistent with post-operative status. Left ventricular diastolic function could not be evaluated due to atrial fibrillation. Left ventricular diastolic function could not be evaluated. Right Ventricle: The right ventricular size is mildly enlarged. No increase in right ventricular wall thickness. Right ventricular systolic function is normal. There is normal pulmonary artery systolic pressure. The tricuspid regurgitant velocity is 2.36  m/s, and with an assumed right atrial pressure of 8 mmHg, the estimated right ventricular systolic pressure is 56.8 mmHg. Left Atrium: Left atrial size was severely dilated. Right Atrium: Right atrial size was  moderately dilated. Pericardium: Trivial pericardial effusion is present. Mitral Valve: The mitral valve is grossly normal. Mild to moderate mitral valve regurgitation. No evidence of mitral valve stenosis. Tricuspid Valve: The tricuspid valve is abnormal. Tricuspid valve regurgitation is moderate . No evidence of tricuspid stenosis. Aortic Valve: The aortic valve is tricuspid. There is mild calcification of the aortic valve. Aortic valve regurgitation is not visualized. Mild aortic valve sclerosis is present, with no evidence of aortic valve stenosis. Pulmonic Valve: The pulmonic valve was grossly normal. Pulmonic valve regurgitation is trivial. No evidence of pulmonic stenosis. Aorta: The aortic root is normal in size and structure. Venous: The inferior vena cava is normal in size with less than 50% respiratory variability, suggesting right atrial pressure of 8 mmHg. IAS/Shunts: The atrial septum is grossly normal.  LEFT VENTRICLE PLAX 2D LVIDd:         5.10 cm  Diastology LVIDs:         3.80 cm  LV e' medial:    6.31 cm/s LV PW:         1.52 cm  LV E/e' medial:  16.6 LV IVS:        1.59 cm  LV e' lateral:   16.40 cm/s LVOT diam:     2.40 cm  LV E/e' lateral: 6.4 LV SV:  71 LV SV Index:   30 LVOT Area:     4.52 cm  RIGHT VENTRICLE RV Basal diam:  4.80 cm RV S prime:     7.62 cm/s TAPSE (M-mode): 2.0 cm LEFT ATRIUM              Index       RIGHT ATRIUM           Index LA diam:        4.70 cm  2.00 cm/m  RA Area:     30.40 cm LA Vol (A2C):   159.0 ml 67.78 ml/m RA Volume:   103.00 ml 43.91 ml/m LA Vol (A4C):   148.0 ml 63.09 ml/m LA Biplane Vol: 158.0 ml 67.35 ml/m  AORTIC VALVE LVOT Vmax:   99.00 cm/s LVOT Vmean:  66.400 cm/s LVOT VTI:    0.157 m  AORTA Ao Root diam: 3.60 cm MITRAL VALVE                TRICUSPID VALVE MV Area (PHT): 3.08 cm     TV Peak grad:   18.7 mmHg MV Decel Time: 246 msec     TV Vmax:        2.16 m/s MV E velocity: 105.00 cm/s  TR Peak grad:   22.3 mmHg                              TR Vmax:        236.00 cm/s                              SHUNTS                             Systemic VTI:  0.16 m                             Systemic Diam: 2.40 cm Eleonore Chiquito MD Electronically signed by Eleonore Chiquito MD Signature Date/Time: 06/25/2020/10:52:06 AM    Final    DG Hip Port Unilat With Pelvis 1V Right  Result Date: 06/25/2020 CLINICAL DATA:  69 year old male status post right femur ORIF. EXAM: DG HIP (WITH OR WITHOUT PELVIS) 1V PORT RIGHT COMPARISON:  Intraoperative images today. FINDINGS: Portable AP supine views at 1936 hours. Right femur intramedullary rod with proximal interlocking dynamic hip screw and distal interlocking cortical screw. Stable alignment about the intertrochanteric fracture, near anatomic. Chronic deformity of the visible left femur with intramedullary rod appears stable. No new osseous abnormality. IMPRESSION: Right femur ORIF today with no adverse features. Electronically Signed   By: Genevie Ann M.D.   On: 06/25/2020 20:13   DG Hip Unilat With Pelvis 2-3 Views Right  Result Date: 06/24/2020 CLINICAL DATA:  Positive hip fracture, mechanical fall last night. EXAM: DG HIP (WITH OR WITHOUT PELVIS) 2-3V RIGHT COMPARISON:  None. FINDINGS: Comminuted intertrochanteric fracture of the right femur. Partially visualized surgical changes of the proximal left femur. Frontal views of the left femur otherwise grossly unremarkable. No hip dislocation bilaterally. No acute fracture of the bones of the pelvis. No diastasis. IMPRESSION: Comminuted intertrochanteric right femoral fracture. Electronically Signed   By: Iven Finn M.D.   On: 06/24/2020 17:03   DG FEMUR, MIN 2 VIEWS RIGHT  Result Date: 06/25/2020 CLINICAL DATA:  ORIF right  femoral fracture EXAM: DG C-ARM 1-60 MIN; RIGHT FEMUR 2 VIEWS FLUOROSCOPY TIME:  Fluoroscopy Time:  55 seconds Radiation Exposure Index (if provided by the fluoroscopic device): 8.61 mGy Number of Acquired Spot Images: 4 images COMPARISON:   06/24/2020 FINDINGS: 4 fluoroscopic images are obtained during the performance of the procedure and are provided for interpretation only. Intramedullary rod with dynamic screw and distal interlocking screw traverses a comminuted intertrochanteric right hip fracture. Alignment is near anatomic. IMPRESSION: 1. ORIF right hip fracture as above. Electronically Signed   By: Randa Ngo M.D.   On: 06/25/2020 19:31    Micro Results    Recent Results (from the past 240 hour(s))  SARS Coronavirus 2 by RT PCR (hospital order, performed in [facility] hospital lab) Nasopharyngeal Nasopharyngeal Swab     Status: None   Collection Time: 06/24/20  5:19 PM   Specimen: Nasopharyngeal Swab  Result Value Ref Range Status   SARS Coronavirus 2 NEGATIVE NEGATIVE Final    Comment: (NOTE) SARS-CoV-2 target nucleic acids are NOT DETECTED.  The SARS-CoV-2 RNA is generally detectable in upper and lower respiratory specimens during the acute phase of infection. The lowest concentration of SARS-CoV-2 viral copies this assay can detect is 250 copies / mL. A negative result does not preclude SARS-CoV-2 infection and should not be used as the sole basis for treatment or other patient management decisions.  A negative result may occur with improper specimen collection / handling, submission of specimen other than nasopharyngeal swab, presence of viral mutation(s) within the areas targeted by this assay, and inadequate number of viral copies (<250 copies / mL). A negative result must be combined with clinical observations, patient history, and epidemiological information.  Fact Sheet for Patients:   StrictlyIdeas.no  Fact Sheet for Healthcare Providers: BankingDealers.co.za  This test is not yet approved or  cleared by the [place] FDA and has been authorized for detection and/or diagnosis of SARS-CoV-2 by FDA under an Emergency Use Authorization (EUA).  This  EUA will remain in effect (meaning this test can be used) for the duration of the COVID-19 declaration under Section 564(b)(1) of the Act, 21 U.S.C. section 360bbb-3(b)(1), unless the authorization is terminated or revoked sooner.  Performed at [facility], [place] [address] [place], [place] [address]   Surgical pcr screen     Status: None   Collection Time: 06/25/20 12:37 AM   Specimen: Nasal Mucosa; Nasal Swab  Result Value Ref Range Status   MRSA, PCR NEGATIVE NEGATIVE Final   Staphylococcus aureus NEGATIVE NEGATIVE Final    Comment: (NOTE) The Xpert SA Assay (FDA approved for NASAL specimens in patients 61 years of age and older), is one component of a comprehensive surveillance program. It is not intended to diagnose infection nor to guide or monitor treatment. Performed at [facility], [place] [address], [place], [place] [address]   SARS Coronavirus 2 by RT PCR (hospital order, performed in [facility] hospital lab) Nasopharyngeal Nasopharyngeal Swab     Status: None   Collection Time: 06/28/20 12:46 PM   Specimen: Nasopharyngeal Swab  Result Value Ref Range Status   SARS Coronavirus 2 NEGATIVE NEGATIVE Final    Comment: (NOTE) SARS-CoV-2 target nucleic acids are NOT DETECTED.  The SARS-CoV-2 RNA is generally detectable in upper and lower respiratory specimens during the acute phase of infection. The lowest concentration of SARS-CoV-2 viral copies this assay can detect is 250 copies / mL. A negative result does not preclude SARS-CoV-2 infection  and should not be used as the sole basis for treatment or other patient management decisions.  A negative result may occur with improper specimen collection / handling, submission of specimen other than nasopharyngeal swab, presence of viral mutation(s) within the areas targeted by this assay, and inadequate number of viral copies (<250 copies / mL). A negative result must be combined with clinical observations, patient  history, and epidemiological information.  Fact Sheet for Patients:   StrictlyIdeas.no  Fact Sheet for Healthcare Providers: BankingDealers.co.za  This test is not yet approved or  cleared by the [place] FDA and has been authorized for detection and/or diagnosis of SARS-CoV-2 by FDA under an Emergency Use Authorization (EUA).  This EUA will remain in effect (meaning this test can be used) for the duration of the COVID-19 declaration under Section 564(b)(1) of the Act, 21 U.S.C. section 360bbb-3(b)(1), unless the authorization is terminated or revoked sooner.  Performed at [facility], [place] [address] [place], [place] [address]     Today   Subjective    Paul Lowery feels much improved since admission.  Feels ready to go home.  Denies chest pain, shortness of breath or abdominal pain.  Feels they can take care of themselves with the resources they have at home.  Objective   Blood pressure (!) 146/65, pulse 75, temperature 98.6 F (37 C), temperature source Oral, resp. rate 18, height 6\' 3"  (1.905 m), weight 106.1 kg, SpO2 95 %.   Intake/Output Summary (Last 24 hours) at 06/28/2020 1437 Last data filed at 06/28/2020 1221 Gross per 24 hour  Intake --  Output 750 ml  Net -750 ml    Exam General: Patient appears well and in good spirits sitting up in bed in no acute distress.  Patient able to speak well with Passy-Muir valve in place. Eyes: sclera anicteric, conjuctiva mild injection bilaterally CVS: S1-S2, regular  Respiratory:   Raspy air sounds bilaterally from secretions in trach GI: NABS, soft, NT  LE: No edema.  Neuro: A/O x 3, Moving all extremities equally with normal strength, CN 3-12 intact, grossly nonfocal.  Psych: patient is logical and coherent, judgement and insight appear normal, mood and affect appropriate to situation.    Data Review   CBC w Diff:  Lab Results  Component Value Date   WBC  6.7 06/28/2020   HGB 7.4 (L) 06/28/2020   HCT 23.8 (L) 06/28/2020   PLT 189 06/28/2020   LYMPHOPCT 11 06/24/2020   MONOPCT 14 06/24/2020   EOSPCT 0 06/24/2020   BASOPCT 0 06/24/2020    CMP:  Lab Results  Component Value Date   NA 135 06/27/2020   K 3.6 06/27/2020   CL 98 06/27/2020   CO2 28 06/27/2020   BUN 29 (H) 06/27/2020   CREATININE 0.87 06/27/2020  .   Total Time in preparing paper work, data evaluation and todays exam - 35 minutes  Vashti Hey M.D on 06/28/2020 at 2:37 PM  [facility]   Office  [phone]

## 2020-06-28 NOTE — Discharge Instructions (Signed)
  Ophelia Charter MD, MPH Noemi Chapel, PA-C [facility] [address] [phone] (tel)   [phone] (fax)   [place] - Please keep dressing intact until followup.  - You may shower on Post-Op Day #2.  - The dressing is water resistant but do not scrub it as it may start to peel up.  - Gently pat the area dry.  - Do not soak the shoulder in water. Do not go swimming in the pool or ocean until your sutures are removed. - KEEP THE INCISIONS CLEAN AND DRY. - You may remove the bandage after 1 week.  - Leave the steri-strips in place - If steri-strips fall off, please place new steri-strips over the incisions   EXERCISES - Follow the instructions of your therapist.  No specific exercises necessary outside of this. - You may bear weight on your operative leg. - Please continue to ambulate and do not stay sitting or lying for too long. Perform foot and wrist pumps to assist in circulation.  POST-OP MEDICATIONS- Multimodal approach to pain control . In general your pain will be controlled with a combination of substances.  Prescriptions unless otherwise discussed are electronically sent to your pharmacy.  This is a carefully made plan we use to minimize narcotic use.     ? Acetaminophen - Non-narcotic pain medicine taken on a scheduled basis  ? Oxycodone - This is a strong narcotic, to be used only on an "as needed" basis for pain. ? Lovenox - This medicine is used to minimize the risk of blood clots after surgery.  FOLLOW-UP - If you develop a Fever (>101.5), Redness or Drainage from the surgical incision site, please call our office to arrange for an evaluation. - Please call the office to schedule a follow-up appointment for your incision check, 10-14 days post-operatively.  IF YOU HAVE ANY QUESTIONS, PLEASE FEEL FREE TO CALL OUR OFFICE.  HELPFUL INFORMATION  You should wean off your narcotic medicines as  soon as you are able.  Most patients will be off or using minimal narcotics before their first postop appointment.   We suggest you use the pain medication the first night prior to going to bed, in order to ease any pain when the anesthesia wears off. You should avoid taking pain medications on an empty stomach as it will make you nauseous.  Do not drink alcoholic beverages or take illicit drugs when taking pain medications.  Pain medication may make you constipated.  Below are a few solutions to try in this order: Decrease the amount of pain medication if you aren't having pain. Drink lots of decaffeinated fluids. Drink prune juice and/or each dried prunes  If the first 3 don't work start with additional solutions Take Colace - an over-the-counter stool softener Take Senokot - an over-the-counter laxative Take Miralax - a stronger over-the-counter laxative

## 2020-06-28 NOTE — Care Management Important Message (Signed)
Important Message  Patient Details  Name: Paul Lowery MRN: [id number] Date of Birth: 1950-11-23   Medicare Important Message Given:  Yes - Important Message mailed due to current National Emergency  Verbal consent obtained due to current National Emergency  Relationship to patient: Self Contact Name: Lin Glazier Call Date: 06/28/20  Time: 1453 Phone: [phone] Outcome: No Answer/Busy Important Message mailed to: Patient address on file    Delorse Lek 06/28/2020, 2:54 PM

## 2020-06-29 ENCOUNTER — Encounter (HOSPITAL_COMMUNITY): Payer: Self-pay | Admitting: Orthopaedic Surgery

## 2020-07-20 ENCOUNTER — Other Ambulatory Visit: Payer: Self-pay

## 2020-07-20 ENCOUNTER — Encounter (HOSPITAL_COMMUNITY): Payer: Self-pay | Admitting: *Deleted

## 2020-07-20 ENCOUNTER — Emergency Department (HOSPITAL_COMMUNITY)
Admission: EM | Admit: 2020-07-20 | Discharge: 2020-07-21 | Disposition: A | Discharge status: Home / Self Care | Payer: Medicare Other | Attending: Emergency Medicine | Admitting: Emergency Medicine

## 2020-07-20 DIAGNOSIS — Z79899 Other long term (current) drug therapy: Secondary | ICD-10-CM | POA: Diagnosis not present

## 2020-07-20 DIAGNOSIS — C091 Malignant neoplasm of tonsillar pillar (anterior) (posterior): Secondary | ICD-10-CM | POA: Insufficient documentation

## 2020-07-20 DIAGNOSIS — R059 Cough, unspecified: Secondary | ICD-10-CM | POA: Insufficient documentation

## 2020-07-20 DIAGNOSIS — I2510 Atherosclerotic heart disease of native coronary artery without angina pectoris: Secondary | ICD-10-CM | POA: Insufficient documentation

## 2020-07-20 DIAGNOSIS — Z7951 Long term (current) use of inhaled steroids: Secondary | ICD-10-CM | POA: Diagnosis not present

## 2020-07-20 DIAGNOSIS — J9509 Other tracheostomy complication: Secondary | ICD-10-CM | POA: Diagnosis not present

## 2020-07-20 DIAGNOSIS — Z85818 Personal history of malignant neoplasm of other sites of lip, oral cavity, and pharynx: Secondary | ICD-10-CM | POA: Insufficient documentation

## 2020-07-20 DIAGNOSIS — J449 Chronic obstructive pulmonary disease, unspecified: Secondary | ICD-10-CM | POA: Insufficient documentation

## 2020-07-20 DIAGNOSIS — I110 Hypertensive heart disease with heart failure: Secondary | ICD-10-CM | POA: Diagnosis not present

## 2020-07-20 DIAGNOSIS — I5032 Chronic diastolic (congestive) heart failure: Secondary | ICD-10-CM | POA: Diagnosis not present

## 2020-07-20 HISTORY — DX: Atherosclerotic heart disease of native coronary artery without angina pectoris: I25.10

## 2020-07-20 HISTORY — DX: Tracheostomy status: Z93.0

## 2020-07-20 HISTORY — DX: Anemia, unspecified: D64.9

## 2020-07-20 HISTORY — DX: Essential (primary) hypertension: I10

## 2020-07-20 HISTORY — DX: Malignant (primary) neoplasm, unspecified: C80.1

## 2020-07-20 NOTE — ED Triage Notes (Signed)
Pt from [facility] for bleeding from his trach. Pt reports this happens often. Per EMS, bleeding noted when coughing. On arrival, bleeding controlled. Pt alert and oriented to situation

## 2020-07-21 ENCOUNTER — Emergency Department (HOSPITAL_COMMUNITY): Payer: Medicare Other

## 2020-07-21 DIAGNOSIS — J9509 Other tracheostomy complication: Secondary | ICD-10-CM | POA: Diagnosis not present

## 2020-07-21 LAB — CBC WITH DIFFERENTIAL/PLATELET
Abs Immature Granulocytes: 0.02 10*3/uL (ref 0.00–0.07)
Basophils Absolute: 0 10*3/uL (ref 0.0–0.1)
Basophils Relative: 1 %
Eosinophils Absolute: 0.1 10*3/uL (ref 0.0–0.5)
Eosinophils Relative: 3 %
HCT: 29.4 % — ABNORMAL LOW (ref 39.0–52.0)
Hemoglobin: 8.8 g/dL — ABNORMAL LOW (ref 13.0–17.0)
Immature Granulocytes: 1 %
Lymphocytes Relative: 16 %
Lymphs Abs: 0.6 10*3/uL — ABNORMAL LOW (ref 0.7–4.0)
MCH: 24.4 pg — ABNORMAL LOW (ref 26.0–34.0)
MCHC: 29.9 g/dL — ABNORMAL LOW (ref 30.0–36.0)
MCV: 81.7 fL (ref 80.0–100.0)
Monocytes Absolute: 0.6 10*3/uL (ref 0.1–1.0)
Monocytes Relative: 15 %
Neutro Abs: 2.6 10*3/uL (ref 1.7–7.7)
Neutrophils Relative %: 64 %
Platelets: 228 10*3/uL (ref 150–400)
RBC: 3.6 MIL/uL — ABNORMAL LOW (ref 4.22–5.81)
RDW: 16.9 % — ABNORMAL HIGH (ref 11.5–15.5)
WBC: 3.9 10*3/uL — ABNORMAL LOW (ref 4.0–10.5)
nRBC: 0 % (ref 0.0–0.2)

## 2020-07-21 LAB — BASIC METABOLIC PANEL
Anion gap: 9 (ref 5–15)
BUN: 18 mg/dL (ref 8–23)
CO2: 30 mmol/L (ref 22–32)
Calcium: 9.4 mg/dL (ref 8.9–10.3)
Chloride: 94 mmol/L — ABNORMAL LOW (ref 98–111)
Creatinine, Ser: 0.7 mg/dL (ref 0.61–1.24)
GFR, Estimated: >60 mL/min (ref >60)
Glucose, Bld: 95 mg/dL (ref 70–99)
Potassium: 4.1 mmol/L (ref 3.5–5.1)
Sodium: 133 mmol/L — ABNORMAL LOW (ref 135–145)

## 2020-07-21 LAB — PROTIME-INR
INR: 1.2 (ref 0.8–1.2)
Prothrombin Time: 14.3 seconds (ref 11.4–15.2)

## 2020-07-21 NOTE — Discharge Instructions (Signed)
Your tracheostomy was evaluated for bleeding.  Your work-up is reassuring.  Avoid aggressive suctioning.

## 2020-07-21 NOTE — ED Notes (Signed)
Tech went into pt room, pt was sleeping, woke pt to get bloodwork and pt then could not set back and moved multiple times and stick was unsuccessful

## 2020-07-21 NOTE — ED Notes (Signed)
[facility] called for transport.  

## 2020-07-21 NOTE — Progress Notes (Signed)
Patient is coughing up thick, bloody secretions. Patient is not bleeding from his trach, just coughing it up at this time.

## 2020-07-21 NOTE — ED Notes (Signed)
[facility] here for transport. 

## 2020-07-21 NOTE — ED Notes (Signed)
Discharge instructions discussed with pt. Pt verbalized understanding. Pt stable and ambulatory. No signature pad available. 

## 2020-07-21 NOTE — ED Provider Notes (Signed)
[facility] EMERGENCY DEPARTMENT Provider Note   CSN: [id number] Arrival date & time: 07/20/20  2353     History Chief Complaint  Patient presents with  . Tracheostomy Tube Change    Paul Lowery is a 69 y.o. male.  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  Patient himself states that he has bleeding quite often from his trach site.  EMS noted bleeding with coughing.  EMS was called because they could not get the bleeding controlled.  Per EMS bleeding controlled in route.  Patient denies any recent illnesses, fevers, shortness of breath.  He has had a cough.  When asked how he is feeling he states "I feel better today than I have in 3 months."  He reports that they aggressively suctioned him and he feels that this is the cause of his bleeding.  Past Medical History:  Diagnosis Date  . Anemia   . Cancer ([place])   . Coronary artery disease   . Hypertension   . Tracheostomy dependent [facility])     Patient Active Problem List   Diagnosis Date Noted  . Chronic diastolic CHF (congestive heart failure) ([place]) 06/25/2020  . Status post tracheostomy ([place]) 06/25/2020  . 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 COPD (chronic obstructive pulmonary disease) ([place]) 06/25/2020  . Essential hypertension 06/25/2020  . Atrial fibrillation, chronic ([place]) 06/25/2020  . Hyperlipidemia 06/25/2020  . Malnutrition of moderate degree 06/25/2020  . Closed comminuted fracture of hip, right, initial encounter ([place]) 06/24/2020    Past Surgical History:  Procedure Laterality Date  . INTRAMEDULLARY (IM) NAIL INTERTROCHANTERIC Right 06/25/2020   Procedure: INTRAMEDULLARY (IM) NAIL INTERTROCHANTRIC;  Surgeon: Hiram Gash, MD;  Location: [facility];  Service: Orthopedics;  Laterality: Right;        No family history on file.  Social History   Tobacco Use  . Smoking status: Not on file  Substance Use Topics  . Alcohol use: Not on file  . Drug use: Not on file    Home Medications Prior to Admission medications   Medication Sig Start Date End Date Taking? Authorizing Provider  acetaminophen (TYLENOL) 325 MG tablet Take 975 mg by mouth every 8 (eight) hours.    [provider]  atorvastatin (LIPITOR) 80 MG tablet Take 80 mg by mouth daily.    [provider]  budesonide (PULMICORT) 0.5 MG/2ML nebulizer solution Take 0.5 mg by nebulization every 12 (twelve) hours.    [provider]  carvedilol (COREG) 25 MG tablet Take 25 mg by mouth every 12 (twelve) hours.    [provider]  enoxaparin (LOVENOX) 40 MG/0.4ML injection Inject 0.4 mLs (40 mg total) into the skin daily. For DVT prophylaxis after surgery 06/28/20 07/28/20  Ethelda Chick, PA-C  fentaNYL (DURAGESIC) 25 MCG/HR Place 1 patch onto the skin every 3 (three) days.    [provider]  fluticasone (FLONASE) 50 MCG/ACT nasal spray Place 1 spray into both nostrils every 12 (twelve) hours as needed for allergies or rhinitis.    [provider]  furosemide (LASIX) 80 MG tablet Take 80 mg by mouth.    [provider]  ipratropium-albuterol (DUONEB) 0.5-2.5 (3) MG/3ML SOLN Take 3 mLs by nebulization every 6 (six) hours.    [provider]  lisinopril (ZESTRIL) 5 MG  tablet Take 5 mg by mouth daily.    [provider]  LORazepam (ATIVAN) 0.5 MG tablet Take 0.5 mg by mouth every 6 (six) hours as needed for anxiety.    [provider]  melatonin 3 MG TABS tablet Take 3 mg by mouth at bedtime.    [provider]  Nutritional Supplements (FEEDING SUPPLEMENT, JEVITY 1.5 CAL,) LIQD Place 250 mLs into feeding tube every 4 (four) hours.    [provider]  oxyCODONE (OXY IR/ROXICODONE) 5 MG immediate release tablet Take 1 pills  every 6 hrs as needed for pain 06/28/20   McBane, Maylene Roes, PA-C  polyethylene glycol (MIRALAX / GLYCOLAX) 17 g packet Take 17 g by mouth daily as needed for mild constipation. 06/28/20   Vashti Hey, MD  senna-docusate (SENOKOT-S) 8.6-50 MG tablet Take 1 tablet by mouth daily.    [provider]  sodium chloride (OCEAN) 0.65 % SOLN nasal spray Place 1 spray into both nostrils as needed for congestion.    [provider]  Water For Irrigation, Sterile (FREE WATER) SOLN Place 175 mLs into feeding tube every 4 (four) hours. 06/28/20   Vashti Hey, MD    Allergies    Patient has no known allergies.  Review of Systems   Review of Systems  Constitutional: Negative for fever.  Respiratory: Positive for cough. Negative for shortness of breath.   Cardiovascular: Negative for chest pain.  Gastrointestinal: Negative for abdominal pain, nausea and vomiting.  Genitourinary: Negative for dysuria.  All other systems reviewed and are negative.   Physical Exam Updated Vital Signs BP (!) 148/91   Pulse 73   Temp 97.9 F (36.6 C) (Temporal)   Resp 20   SpO2 100%   Physical Exam Vitals and nursing note reviewed.  Constitutional:      General: He is not in acute distress.    Appearance: He is well-developed. He is not diaphoretic.     Comments: Chronically ill-appearing but nontoxic  HENT:     Head: Normocephalic and atraumatic.     Nose: Nose normal.     Mouth/Throat:     Mouth: Mucous membranes are dry.  Eyes:     Pupils: Pupils are equal, round, and reactive to light.  Neck:     Comments: Trach in place, blood noted around gauze, inner cannula noted to have significant blood but no active bleeding noted Cardiovascular:     Rate and Rhythm: Normal rate and regular rhythm.     Heart sounds: Normal heart sounds. No murmur heard.   Pulmonary:     Effort: Pulmonary effort is normal. No respiratory distress.     Breath sounds: Normal breath  sounds. No wheezing.     Comments: Rhonchorous breath sounds right lung throughout all lobes, fair air movement Abdominal:     General: Bowel sounds are normal.     Palpations: Abdomen is soft.     Tenderness: There is no abdominal tenderness. There is no rebound.     Comments: G-tube in place without obvious overlying skin changes or erythema  Musculoskeletal:     Cervical back: Neck supple.     Right lower leg: No edema.     Left lower leg: No edema.  Lymphadenopathy:     Cervical: No cervical adenopathy.  Skin:    General: Skin is warm and dry.  Neurological:     Mental Status: He is alert and oriented to person, place, and time.  Psychiatric:  Mood and Affect: Mood normal.     ED Results / Procedures / Treatments   Labs (all labs ordered are listed, but only abnormal results are displayed) Labs Reviewed  CBC WITH DIFFERENTIAL/PLATELET - Abnormal; Notable for the following components:      Result Value   WBC 3.9 (*)    RBC 3.60 (*)    Hemoglobin 8.8 (*)    HCT 29.4 (*)    MCH 24.4 (*)    MCHC 29.9 (*)    RDW 16.9 (*)    Lymphs Abs 0.6 (*)    All other components within normal limits  BASIC METABOLIC PANEL - Abnormal; Notable for the following components:   Sodium 133 (*)    Chloride 94 (*)    All other components within normal limits  PROTIME-INR    EKG None  Radiology DG Chest Portable 1 View  Result Date: 07/21/2020 CLINICAL DATA:  Tracheostomy hemorrhage EXAM: PORTABLE CHEST 1 VIEW COMPARISON:  06/25/2020 FINDINGS: Tracheostomy is partially visualized at the thoracic inlet. The lungs are symmetrically well expanded and are clear. No pneumothorax or pleural effusion. Coronary artery bypass grafting has been performed. Moderate cardiomegaly is stable. No acute bone abnormality is seen. Healed right rib fracture noted. IMPRESSION: No active disease.  Stable moderate cardiomegaly. Electronically Signed   By: Fidela Salisbury MD   On: 07/21/2020 00:33     Procedures Procedures (including critical care time)  Medications Ordered in ED Medications - No data to display  ED Course  I have reviewed the triage vital signs and the nursing notes.  Pertinent labs & imaging results that were available during my care of the patient were reviewed by me and considered in my medical decision making (see chart for details).    MDM Rules/Calculators/A&P                          Patient presents with bleeding at his trach site.  He is overall nontoxic and vital signs are reassuring.  He is chronically ill-appearing.  Patient reports that he has some chronic oozing at his trach site.  He thinks that the bleeding is related to aggressive suctioning.  He has had a recent cough.  He has some rhonchorous breath sounds in the right but he is not hypoxic and is no respiratory distress.  Chest x-ray obtained and is largely unremarkable.  CBC reviewed and hemoglobin is 8.8 which is up from his most recent of 7.4.  Otherwise no significant metabolic derangements.  Inner cannula was traded out and patient rested comfortably without any recurrent bleeding or issue.  [facility] to [facility].  After history, exam, and medical workup I feel the patient has been appropriately medically screened and is safe for discharge home. Pertinent diagnoses were discussed with the patient. Patient was given return precautions.  Final Clinical Impression(s) / ED Diagnoses Final diagnoses:  Other tracheostomy complication [facility])    Rx / DC Orders ED Discharge Orders    None       Oswaldo Cueto, Barbette Hair, MD 07/21/20 [phone]

## 2020-07-21 NOTE — ED Notes (Signed)
Report given to [facility]

## 2020-09-25 ENCOUNTER — Other Ambulatory Visit: Payer: Self-pay

## 2020-09-25 ENCOUNTER — Inpatient Hospital Stay (HOSPITAL_COMMUNITY): Payer: Medicare Other

## 2020-09-25 ENCOUNTER — Inpatient Hospital Stay (HOSPITAL_COMMUNITY)
Admission: EM | Admit: 2020-09-25 | Discharge: 2020-09-28 | DRG: 205 | Disposition: A | Discharge status: Other Acute Inpatient Hospital | Payer: Medicare Other | Source: Skilled Nursing Facility | Attending: Internal Medicine | Admitting: Internal Medicine

## 2020-09-25 ENCOUNTER — Encounter (HOSPITAL_COMMUNITY): Payer: Self-pay

## 2020-09-25 DIAGNOSIS — C760 Malignant neoplasm of head, face and neck: Secondary | ICD-10-CM | POA: Diagnosis not present

## 2020-09-25 DIAGNOSIS — D62 Acute posthemorrhagic anemia: Secondary | ICD-10-CM

## 2020-09-25 DIAGNOSIS — D500 Iron deficiency anemia secondary to blood loss (chronic): Secondary | ICD-10-CM | POA: Diagnosis present

## 2020-09-25 DIAGNOSIS — Z7951 Long term (current) use of inhaled steroids: Secondary | ICD-10-CM

## 2020-09-25 DIAGNOSIS — R042 Hemoptysis: Secondary | ICD-10-CM | POA: Diagnosis present

## 2020-09-25 DIAGNOSIS — C321 Malignant neoplasm of supraglottis: Secondary | ICD-10-CM | POA: Diagnosis present

## 2020-09-25 DIAGNOSIS — Z951 Presence of aortocoronary bypass graft: Secondary | ICD-10-CM

## 2020-09-25 DIAGNOSIS — Z66 Do not resuscitate: Secondary | ICD-10-CM | POA: Diagnosis present

## 2020-09-25 DIAGNOSIS — J9501 Hemorrhage from tracheostomy stoma: Principal | ICD-10-CM | POA: Diagnosis present

## 2020-09-25 DIAGNOSIS — Y95 Nosocomial condition: Secondary | ICD-10-CM | POA: Diagnosis present

## 2020-09-25 DIAGNOSIS — Z7902 Long term (current) use of antithrombotics/antiplatelets: Secondary | ICD-10-CM

## 2020-09-25 DIAGNOSIS — I110 Hypertensive heart disease with heart failure: Secondary | ICD-10-CM | POA: Diagnosis present

## 2020-09-25 DIAGNOSIS — Z87891 Personal history of nicotine dependence: Secondary | ICD-10-CM | POA: Diagnosis not present

## 2020-09-25 DIAGNOSIS — I2510 Atherosclerotic heart disease of native coronary artery without angina pectoris: Secondary | ICD-10-CM | POA: Diagnosis present

## 2020-09-25 DIAGNOSIS — D649 Anemia, unspecified: Secondary | ICD-10-CM | POA: Diagnosis present

## 2020-09-25 DIAGNOSIS — I5032 Chronic diastolic (congestive) heart failure: Secondary | ICD-10-CM | POA: Diagnosis present

## 2020-09-25 DIAGNOSIS — E440 Moderate protein-calorie malnutrition: Secondary | ICD-10-CM | POA: Diagnosis present

## 2020-09-25 DIAGNOSIS — Z20822 Contact with and (suspected) exposure to covid-19: Secondary | ICD-10-CM | POA: Diagnosis present

## 2020-09-25 DIAGNOSIS — E119 Type 2 diabetes mellitus without complications: Secondary | ICD-10-CM | POA: Diagnosis present

## 2020-09-25 DIAGNOSIS — I4891 Unspecified atrial fibrillation: Secondary | ICD-10-CM | POA: Diagnosis present

## 2020-09-25 DIAGNOSIS — J9622 Acute and chronic respiratory failure with hypercapnia: Secondary | ICD-10-CM | POA: Diagnosis present

## 2020-09-25 DIAGNOSIS — J440 Chronic obstructive pulmonary disease with acute lower respiratory infection: Secondary | ICD-10-CM | POA: Diagnosis present

## 2020-09-25 DIAGNOSIS — Z79899 Other long term (current) drug therapy: Secondary | ICD-10-CM | POA: Diagnosis not present

## 2020-09-25 DIAGNOSIS — Z7189 Other specified counseling: Secondary | ICD-10-CM

## 2020-09-25 DIAGNOSIS — C091 Malignant neoplasm of tonsillar pillar (anterior) (posterior): Secondary | ICD-10-CM | POA: Diagnosis present

## 2020-09-25 DIAGNOSIS — Z930 Tracheostomy status: Secondary | ICD-10-CM

## 2020-09-25 DIAGNOSIS — Z923 Personal history of irradiation: Secondary | ICD-10-CM | POA: Diagnosis not present

## 2020-09-25 DIAGNOSIS — I10 Essential (primary) hypertension: Secondary | ICD-10-CM | POA: Diagnosis not present

## 2020-09-25 DIAGNOSIS — J9621 Acute and chronic respiratory failure with hypoxia: Secondary | ICD-10-CM | POA: Diagnosis present

## 2020-09-25 DIAGNOSIS — Z515 Encounter for palliative care: Secondary | ICD-10-CM | POA: Diagnosis not present

## 2020-09-25 DIAGNOSIS — R0902 Hypoxemia: Secondary | ICD-10-CM

## 2020-09-25 DIAGNOSIS — J189 Pneumonia, unspecified organism: Secondary | ICD-10-CM | POA: Diagnosis not present

## 2020-09-25 LAB — BASIC METABOLIC PANEL
Anion gap: 9 (ref 5–15)
BUN: 31 mg/dL — ABNORMAL HIGH (ref 8–23)
CO2: 30 mmol/L (ref 22–32)
Calcium: 8.6 mg/dL — ABNORMAL LOW (ref 8.9–10.3)
Chloride: 94 mmol/L — ABNORMAL LOW (ref 98–111)
Creatinine, Ser: 0.89 mg/dL (ref 0.61–1.24)
GFR, Estimated: >60 mL/min (ref >60)
Glucose, Bld: 107 mg/dL — ABNORMAL HIGH (ref 70–99)
Potassium: 4.3 mmol/L (ref 3.5–5.1)
Sodium: 133 mmol/L — ABNORMAL LOW (ref 135–145)

## 2020-09-25 LAB — CBC
HCT: 21.2 % — ABNORMAL LOW (ref 39.0–52.0)
Hemoglobin: 6.6 g/dL — CL (ref 13.0–17.0)
MCH: 25.3 pg — ABNORMAL LOW (ref 26.0–34.0)
MCHC: 31.1 g/dL (ref 30.0–36.0)
MCV: 81.2 fL (ref 80.0–100.0)
Platelets: 306 10*3/uL (ref 150–400)
RBC: 2.61 MIL/uL — ABNORMAL LOW (ref 4.22–5.81)
RDW: 18.2 % — ABNORMAL HIGH (ref 11.5–15.5)
WBC: 9.9 10*3/uL (ref 4.0–10.5)
nRBC: 0 % (ref 0.0–0.2)

## 2020-09-25 LAB — HEMOGLOBIN AND HEMATOCRIT, BLOOD
HCT: 23.3 % — ABNORMAL LOW (ref 39.0–52.0)
Hemoglobin: 7.6 g/dL — ABNORMAL LOW (ref 13.0–17.0)

## 2020-09-25 LAB — PREPARE RBC (CROSSMATCH)

## 2020-09-25 LAB — RESP PANEL BY RT-PCR (FLU A&B, COVID) ARPGX2
Influenza A by PCR: NEGATIVE
Influenza B by PCR: NEGATIVE
SARS Coronavirus 2 by RT PCR: NEGATIVE

## 2020-09-25 LAB — MRSA PCR SCREENING: MRSA by PCR: NEGATIVE

## 2020-09-25 MED ORDER — FUROSEMIDE 40 MG PO TABS
80.0000 mg | ORAL_TABLET | Freq: Every day | ORAL | Status: DC
  Administered 2020-09-25 – 2020-09-28 (×4): 80 mg
  Filled 2020-09-25 (×4): qty 2

## 2020-09-25 MED ORDER — HYDROMORPHONE HCL 2 MG PO TABS
2.0000 mg | ORAL_TABLET | Freq: Three times a day (TID) | ORAL | Status: DC | PRN
  Administered 2020-09-26 – 2020-09-28 (×4): 2 mg
  Filled 2020-09-25 (×4): qty 1

## 2020-09-25 MED ORDER — MELATONIN 3 MG PO TABS
3.0000 mg | ORAL_TABLET | Freq: Every day | ORAL | Status: DC
  Administered 2020-09-25 – 2020-09-27 (×3): 3 mg
  Filled 2020-09-25 (×3): qty 1

## 2020-09-25 MED ORDER — DOXYCYCLINE HYCLATE 100 MG PO TABS
100.0000 mg | ORAL_TABLET | Freq: Two times a day (BID) | ORAL | Status: DC
  Administered 2020-09-25 – 2020-09-28 (×7): 100 mg
  Filled 2020-09-25 (×7): qty 1

## 2020-09-25 MED ORDER — FLUTICASONE PROPIONATE 50 MCG/ACT NA SUSP
1.0000 | Freq: Two times a day (BID) | NASAL | Status: DC | PRN
  Filled 2020-09-25: qty 16

## 2020-09-25 MED ORDER — POLYETHYLENE GLYCOL 3350 17 G PO PACK: 17.0000 g | PACK | Freq: Every day | ORAL | Status: DC | PRN

## 2020-09-25 MED ORDER — SODIUM CHLORIDE 0.9 % IV SOLN
10.0000 mL/h | Freq: Once | INTRAVENOUS | Status: AC
  Administered 2020-09-25: 11:00:00 10 mL/h via INTRAVENOUS

## 2020-09-25 MED ORDER — LIP MEDEX EX OINT
1.0000 "application " | TOPICAL_OINTMENT | CUTANEOUS | Status: DC | PRN
  Administered 2020-09-25: 1 via TOPICAL
  Filled 2020-09-25 (×2): qty 7

## 2020-09-25 MED ORDER — CHLORHEXIDINE GLUCONATE CLOTH 2 % EX PADS
6.0000 | MEDICATED_PAD | Freq: Every day | CUTANEOUS | Status: DC
  Administered 2020-09-25 – 2020-09-27 (×2): 6 via TOPICAL

## 2020-09-25 MED ORDER — SODIUM CHLORIDE 0.9 % IV SOLN
2.0000 g | INTRAVENOUS | Status: DC
  Administered 2020-09-25 – 2020-09-26 (×2): 2 g via INTRAVENOUS
  Filled 2020-09-25 (×2): qty 20

## 2020-09-25 MED ORDER — BUDESONIDE 0.5 MG/2ML IN SUSP
0.5000 mg | Freq: Two times a day (BID) | RESPIRATORY_TRACT | Status: DC
  Administered 2020-09-25 – 2020-09-28 (×7): 0.5 mg via RESPIRATORY_TRACT
  Filled 2020-09-25 (×7): qty 2

## 2020-09-25 MED ORDER — LORATADINE 10 MG PO TABS
10.0000 mg | ORAL_TABLET | Freq: Every day | ORAL | Status: DC
  Administered 2020-09-25 – 2020-09-28 (×4): 10 mg
  Filled 2020-09-25 (×4): qty 1

## 2020-09-25 MED ORDER — ACETAMINOPHEN 325 MG PO TABS
975.0000 mg | ORAL_TABLET | Freq: Three times a day (TID) | ORAL | Status: DC
  Administered 2020-09-25 – 2020-09-28 (×9): 975 mg
  Filled 2020-09-25 (×10): qty 3

## 2020-09-25 MED ORDER — SENNOSIDES-DOCUSATE SODIUM 8.6-50 MG PO TABS
1.0000 | ORAL_TABLET | Freq: Every day | ORAL | Status: DC
  Administered 2020-09-25 – 2020-09-28 (×4): 1
  Filled 2020-09-25 (×4): qty 1

## 2020-09-25 MED ORDER — SODIUM CHLORIDE 0.9% FLUSH
3.0000 mL | Freq: Two times a day (BID) | INTRAVENOUS | Status: DC
  Administered 2020-09-25 – 2020-09-28 (×5): 3 mL via INTRAVENOUS

## 2020-09-25 MED ORDER — ATORVASTATIN CALCIUM 40 MG PO TABS
80.0000 mg | ORAL_TABLET | Freq: Every day | ORAL | Status: DC
  Administered 2020-09-25 – 2020-09-28 (×4): 80 mg
  Filled 2020-09-25 (×4): qty 2

## 2020-09-25 MED ORDER — IPRATROPIUM-ALBUTEROL 0.5-2.5 (3) MG/3ML IN SOLN
3.0000 mL | Freq: Four times a day (QID) | RESPIRATORY_TRACT | Status: DC
  Administered 2020-09-25 – 2020-09-27 (×10): 3 mL via RESPIRATORY_TRACT
  Filled 2020-09-25 (×10): qty 3

## 2020-09-25 MED ORDER — ORAL CARE MOUTH RINSE: 15.0000 mL | OROMUCOSAL | Status: DC

## 2020-09-25 MED ORDER — GUAIFENESIN 100 MG/5ML PO SOLN
10.0000 mL | Freq: Two times a day (BID) | ORAL | Status: DC
  Administered 2020-09-25 – 2020-09-28 (×7): 200 mg
  Filled 2020-09-25: qty 20
  Filled 2020-09-25 (×4): qty 10
  Filled 2020-09-25: qty 20
  Filled 2020-09-25: qty 10

## 2020-09-25 MED ORDER — CHLORHEXIDINE GLUCONATE 0.12% ORAL RINSE (MEDLINE KIT)
15.0000 mL | Freq: Two times a day (BID) | OROMUCOSAL | Status: DC
  Administered 2020-09-25: 21:00:00 15 mL via OROMUCOSAL

## 2020-09-25 MED ORDER — CARVEDILOL 12.5 MG PO TABS
25.0000 mg | ORAL_TABLET | Freq: Two times a day (BID) | ORAL | Status: DC
  Administered 2020-09-25 – 2020-09-28 (×7): 25 mg
  Filled 2020-09-25 (×7): qty 2

## 2020-09-25 MED ORDER — OXYCODONE HCL 5 MG PO TABS
5.0000 mg | ORAL_TABLET | Freq: Four times a day (QID) | ORAL | Status: DC
  Administered 2020-09-25 – 2020-09-28 (×11): 5 mg
  Filled 2020-09-25 (×12): qty 1

## 2020-09-25 NOTE — Progress Notes (Signed)
Rt called to room WA 18 for trach bleeding. MD at bedside. Pt has some bright red blood from trach. Per MD pt has had this before with his current hx. Rt suction pt/thick bright red blood. Rt placed pt on 28% TC /trach patent. No distress noted at this time. Pt had # 6 XLT.

## 2020-09-25 NOTE — ED Provider Notes (Signed)
[facility] DEPT Provider Note   CSN: [id number] Arrival date & time: 09/25/20  1245     History Chief Complaint  Patient presents with  . Throat Bleeding    Paul Lowery is a 69 y.o. male.  Patient with hx advanced supraglottic cancer, chronic trach and gastrostomy tube, presents from [facility] via EMS with bleeding for trach site. Hx same with prior evaluations and [facility] and here for same. Pt with chronic, recurrent oozing, mild bleeding from site with periodic exacerbations. No other abnormal bruising or bleeding noted. Pt limited historian - no verbally responsive - level 5 caveat. Pt denies increased pain or soreness to neck area. Denies increased cough or shortness of breath, and does indicate he feels he is getting adequate air. No fevers noted. Pt is generally weak, pale, and chronically ill appearing.   The history is provided by the patient and the EMS personnel. The history is limited by the condition of the patient.       Past Medical History:  Diagnosis Date  . Anemia   . Cancer ([place])   . Coronary artery disease   . Hypertension   . Tracheostomy dependent [facility])     Patient Active Problem List   Diagnosis Date Noted  . Chronic diastolic CHF (congestive heart failure) ([place]) 06/25/2020  . Status post tracheostomy ([place]) 06/25/2020  . 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 COPD (chronic obstructive pulmonary disease) ([place]) 06/25/2020  . Essential hypertension 06/25/2020  . Atrial fibrillation, chronic ([place]) 06/25/2020  . Hyperlipidemia 06/25/2020  . Malnutrition of moderate degree 06/25/2020  . Closed comminuted fracture of hip, right, initial encounter ([place]) 06/24/2020    Past Surgical History:  Procedure Laterality Date  . INTRAMEDULLARY (IM) NAIL INTERTROCHANTERIC Right 06/25/2020   Procedure: INTRAMEDULLARY (IM) NAIL INTERTROCHANTRIC;  Surgeon: Hiram Gash, MD;  Location: [facility];  Service: Orthopedics;   Laterality: Right;       History reviewed. No pertinent family history.  Social History   Tobacco Use  . Smoking status: Former Research scientist (life sciences)  . Smokeless tobacco: Never Used    Home Medications Prior to Admission medications   Medication Sig Start Date End Date Taking? Authorizing Provider  acetaminophen (TYLENOL) 325 MG tablet Take 975 mg by mouth every 8 (eight) hours.    [provider]  atorvastatin (LIPITOR) 80 MG tablet Take 80 mg by mouth daily.    [provider]  budesonide (PULMICORT) 0.5 MG/2ML nebulizer solution Take 0.5 mg by nebulization every 12 (twelve) hours.    [provider]  carvedilol (COREG) 25 MG tablet Take 25 mg by mouth every 12 (twelve) hours.    [provider]  enoxaparin (LOVENOX) 40 MG/0.4ML injection Inject 0.4 mLs (40 mg total) into the skin daily. For DVT prophylaxis after surgery 06/28/20 07/28/20  Ethelda Chick, PA-C  fentaNYL (DURAGESIC) 25 MCG/HR Place 1 patch onto the skin every 3 (three) days.    [provider]  fluticasone (FLONASE) 50 MCG/ACT nasal spray Place 1 spray into both nostrils every 12 (twelve) hours as needed for allergies or rhinitis.    [provider]  furosemide (LASIX) 80 MG tablet Take 80 mg by mouth.    [provider]  ipratropium-albuterol (DUONEB) 0.5-2.5 (3) MG/3ML SOLN Take 3 mLs by nebulization every 6 (six) hours.    [provider]  lisinopril (ZESTRIL) 5 MG tablet Take 5 mg by mouth daily.    [provider]  LORazepam (ATIVAN)  0.5 MG tablet Take 0.5 mg by mouth every 6 (six) hours as needed for anxiety.    [provider]  melatonin 3 MG TABS tablet Take 3 mg by mouth at bedtime.    [provider]  Nutritional Supplements (FEEDING SUPPLEMENT, JEVITY 1.5 CAL,) LIQD Place 250 mLs into feeding tube every 4 (four) hours.    [provider]  oxyCODONE (OXY IR/ROXICODONE) 5 MG immediate release tablet Take 1 pills  every 6 hrs as needed for pain 06/28/20   McBane, Maylene Roes, PA-C  polyethylene glycol (MIRALAX / GLYCOLAX) 17 g packet Take 17 g by mouth daily as needed for mild constipation. 06/28/20   Vashti Hey, MD  senna-docusate (SENOKOT-S) 8.6-50 MG tablet Take 1 tablet by mouth daily.    [provider]  sodium chloride (OCEAN) 0.65 % SOLN nasal spray Place 1 spray into both nostrils as needed for congestion.    [provider]  Water For Irrigation, Sterile (FREE WATER) SOLN Place 175 mLs into feeding tube every 4 (four) hours. 06/28/20   Vashti Hey, MD    Allergies    Patient has no known allergies.  Review of Systems   Review of Systems  Constitutional: Negative for fever.  HENT: Negative for nosebleeds.   Eyes: Negative for redness.  Respiratory: Negative for shortness of breath.   Cardiovascular: Negative for chest pain.  Gastrointestinal: Negative for abdominal pain and blood in stool.  Genitourinary: Negative for flank pain.  Musculoskeletal: Negative for back pain.  Skin: Negative for rash.  Neurological: Positive for weakness. Negative for headaches.       General weakness. No unilateral weakness.   Hematological: Does not bruise/bleed easily.  Psychiatric/Behavioral: Negative for confusion.    Physical Exam Updated Vital Signs BP 128/69   Pulse 75   Temp 97.7 F (36.5 C) (Oral)   Resp 18   SpO2 100%   Physical Exam Vitals and nursing note reviewed.  Constitutional:      Appearance: He is well-developed.     Comments: Weak/frail, pale appearing.  HENT:     Head: Atraumatic.     Nose: Nose normal.     Mouth/Throat:     Mouth: Mucous membranes are moist.     Pharynx: Oropharynx is clear.  Eyes:     General: No scleral icterus. Neck:     Trachea: No tracheal deviation.     Comments: Trach intact. Small amount blood on trach and dressing, no brisk and/or active bleeding is noted.  Cardiovascular:     Rate and Rhythm:  Normal rate and regular rhythm.     Pulses: Normal pulses.     Heart sounds: Normal heart sounds. No murmur heard. No friction rub. No gallop.   Pulmonary:     Effort: Pulmonary effort is normal. No accessory muscle usage or respiratory distress.     Breath sounds: Normal breath sounds.  Abdominal:     General: Bowel sounds are normal. There is no distension.     Palpations: Abdomen is soft.     Tenderness: There is no abdominal tenderness.     Comments: Gastrostomy site intact without sign of infection, +small amount blood on g tube sponge/dressing.   Genitourinary:    Comments: No cva tenderness. Musculoskeletal:        General: No swelling.     Cervical back: Neck supple. No rigidity.  Skin:    General: Skin is warm and dry.     Coloration: Skin is  pale.     Findings: No rash.  Neurological:     Mental Status: He is alert.     Comments: Alert. Motor/sens grossly intact bil.   Psychiatric:        Mood and Affect: Mood normal.     ED Results / Procedures / Treatments   Labs (all labs ordered are listed, but only abnormal results are displayed) Results for orders placed or performed during the hospital encounter of 09/25/20  CBC  Result Value Ref Range   WBC 9.9 4.0 - 10.5 K/uL   RBC 2.61 (L) 4.22 - 5.81 MIL/uL   Hemoglobin 6.6 (LL) 13.0 - 17.0 g/dL   HCT 21.2 (L) 39.0 - 52.0 %   MCV 81.2 80.0 - 100.0 fL   MCH 25.3 (L) 26.0 - 34.0 pg   MCHC 31.1 30.0 - 36.0 g/dL   RDW 18.2 (H) 11.5 - 15.5 %   Platelets 306 150 - 400 K/uL   nRBC 0.0 0.0 - 0.2 %  Basic metabolic panel  Result Value Ref Range   Sodium 133 (L) 135 - 145 mmol/L   Potassium 4.3 3.5 - 5.1 mmol/L   Chloride 94 (L) 98 - 111 mmol/L   CO2 30 22 - 32 mmol/L   Glucose, Bld 107 (H) 70 - 99 mg/dL   BUN 31 (H) 8 - 23 mg/dL   Creatinine, Ser 0.89 0.61 - 1.24 mg/dL   Calcium 8.6 (L) 8.9 - 10.3 mg/dL   GFR, Estimated >60 >60 mL/min   Anion gap 9 5 - 15    EKG None  Radiology No results  found.  Procedures Procedures (including critical care time)  Medications Ordered in ED Medications - No data to display  ED Course  I have reviewed the triage vital signs and the nursing notes.  Pertinent labs & imaging results that were available during my care of the patient were reviewed by me and considered in my medical decision making (see chart for details).    MDM Rules/Calculators/A&P                         Labs sent. Continuous pulse ox and cardiac monitoring.   Reviewed nursing notes and prior charts for additional history.  Prior records reviewed - visit at [facility] a couple months ago, notes then recommending referral to palliative/hospice care.  Given nature underlying condition, pt with poor/grave longer term prognosis, and at risk of chronic and/or recurrent bleeding.    Currently no brisk and/or active bleeding is noted.   Labs reviewed/interpreted by me - hemoglobin decreased from prior, 6.6 today. Pt noted to have prior transfusion for same. Transfuse 2 units prbc.   Palliative care consult made. Discussed pt - will see.   Hospitalists consulted for admission/transfusion.     Final Clinical Impression(s) / ED Diagnoses Final diagnoses:  None    Rx / DC Orders ED Discharge Orders    None       Lajean Saver, MD 09/25/20 [phone]

## 2020-09-25 NOTE — Consult Note (Signed)
Reason for Consult: Hemoptysis, bleeding from trach tube  HPI:  Paul Lowery is an 69 y.o. male who was brought from [facility] via EMS for recurrent bleeding from his trach tube. 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  Pt reports chronic recurrent oozing from mouth and trach site with periodic exacerbations. No other abnormal bruising or bleeding noted. Pt is a limited historian - minimal verbal response.  No fevers noted. Pt is generally weak, pale, and chronically ill appearing.   Past Medical History:  Diagnosis Date  . Anemia   . Cancer ([place])   . Coronary artery disease   . Hypertension   . Tracheostomy dependent [facility])     Past Surgical History:  Procedure Laterality Date  . INTRAMEDULLARY (IM) NAIL INTERTROCHANTERIC Right 06/25/2020   Procedure: INTRAMEDULLARY (IM) NAIL INTERTROCHANTRIC;  Surgeon: Hiram Gash, MD;  Location: [facility];  Service: Orthopedics;  Laterality: Right;    History reviewed. No pertinent family history.  Social History:  reports that he has quit smoking. He has never used smokeless tobacco. No history on file for alcohol use and drug use.  Allergies: No Known Allergies  Prior to Admission medications   Medication Sig Start Date End Date Taking? Authorizing Provider  acetaminophen (TYLENOL) 325 MG tablet Place 975 mg into feeding tube every 8 (eight) hours.   Yes [provider]  atorvastatin (LIPITOR) 80 MG tablet Place 80 mg into feeding tube daily.   Yes [provider]  budesonide (PULMICORT) 0.5 MG/2ML nebulizer solution Take 0.5 mg by nebulization every 12 (twelve) hours.   Yes [provider]  carvedilol (COREG) 25 MG tablet Place 25 mg into feeding tube every 12 (twelve) hours.   Yes [provider]  chlorhexidine (PERIDEX) 0.12 % solution Use as directed 5 mLs in the mouth or throat 2 (two) times daily.   Yes [provider]  Cholecalciferol (VITAMIN D3) 50 MCG (2000 UT) CHEW Place 2,000 Units into feeding tube at bedtime.   Yes [provider]  enoxaparin (LOVENOX) 40 MG/0.4ML injection Inject 0.4 mLs (40 mg total) into the skin daily. For DVT prophylaxis after surgery 06/28/20 07/28/20 Yes McBane, Maylene Roes, PA-C  fluticasone (FLONASE) 50 MCG/ACT nasal spray Place 1 spray into both nostrils every 12 (twelve) hours as needed for allergies or rhinitis.   Yes [provider]  furosemide (LASIX) 80 MG tablet Place 80 mg into feeding tube daily.   Yes [provider]  guaiFENesin (ROBITUSSIN) 100 MG/5ML SOLN Place 10 mLs into feeding tube every 12 (twelve) hours.   Yes [provider]  HYDROmorphone (DILAUDID) 2 MG tablet Place 2 mg into feeding tube every 8 (eight) hours as needed for severe pain.   Yes [provider]  ipratropium-albuterol (DUONEB) 0.5-2.5 (3) MG/3ML SOLN Take 3 mLs by nebulization every 6 (six) hours.   Yes [provider]  lisinopril (ZESTRIL) 5 MG tablet Place 5 mg into feeding tube daily.   Yes [provider]  loratadine (CLARITIN) 10 MG tablet Place 10 mg into feeding tube daily.   Yes [provider]  LORazepam (ATIVAN) 1 MG tablet Place 1 mg into feeding tube every 6 (six) hours as needed for anxiety.   Yes [provider]  melatonin 3 MG TABS tablet Place 3  mg into feeding tube at bedtime.   Yes [provider]  Nutritional Supplements (FEEDING SUPPLEMENT, JEVITY 1.5 CAL,) LIQD Place 250 mLs into feeding tube every 4 (four) hours.   Yes [provider]  oxyCODONE (OXY IR/ROXICODONE) 5 MG immediate release tablet Take 1 pills every 6 hrs as needed for pain Patient taking differently: Place 5 mg into feeding tube every 6 (six) hours. 06/28/20  Yes  McBane, Maylene Roes, PA-C  polyethylene glycol (MIRALAX / GLYCOLAX) 17 g packet Take 17 g by mouth daily as needed for mild constipation. Patient taking differently: Place 17 g into feeding tube daily as needed for mild constipation. 06/28/20  Yes Vashti Hey, MD  Racepinephrine HCl (S2, RACEPINEPHRINE,) 2.25 % NEBU nebulizer solution Take 0.5 mLs by nebulization every 6 (six) hours as needed (shortness of breath, wheezing). Give with 34mL iced saline   Yes [provider]  senna-docusate (SENOKOT-S) 8.6-50 MG tablet Place 1 tablet into feeding tube daily.   Yes [provider]  sodium chloride (OCEAN) 0.65 % SOLN nasal spray Place 1 spray into both nostrils daily as needed for congestion.   Yes [provider]  Water For Irrigation, Sterile (FREE WATER) SOLN Place 175 mLs into feeding tube every 4 (four) hours. 06/28/20  Yes Vashti Hey, MD    Medications:  I have reviewed the patient's current medications. Scheduled: . acetaminophen  975 mg Per Tube Q8H  . atorvastatin  80 mg Per Tube Daily  . budesonide  0.5 mg Nebulization Q12H  . carvedilol  25 mg Per Tube BID  . doxycycline  100 mg Per Tube Q12H  . furosemide  80 mg Per Tube Daily  . guaiFENesin  10 mL Per Tube Q12H  . ipratropium-albuterol  3 mL Nebulization Q6H  . loratadine  10 mg Per Tube Daily  . melatonin  3 mg Per Tube QHS  . oxyCODONE  5 mg Per Tube Q6H  . senna-docusate  1 tablet Per Tube Daily  . sodium chloride flush  3 mL Intravenous Q12H   Continuous: . cefTRIAXone (ROCEPHIN)  IV Stopped (09/25/20 1410)    DG CHEST PORT 1 VIEW  Result Date: 09/25/2020 CLINICAL DATA:  Bloody sputum.  Tracheostomy. EXAM: PORTABLE CHEST 1 VIEW COMPARISON:  07/21/2020 FINDINGS: Tracheostomy tube is seen in expected position. Prior CABG again noted. Cardiomegaly and pulmonary vascular congestion remains stable. New opacity is seen in the medial left lower lobe, suspicious for pneumonia.  Right lung is clear. No evidence of pleural effusion. IMPRESSION: New opacity in medial left lower lobe, suspicious for pneumonia. Stable cardiomegaly and pulmonary vascular congestion. Electronically Signed   By: Marlaine Hind M.D.   On: 09/25/2020 11:43   Review of System Unable to obtain.  Blood pressure 116/64, pulse 86, temperature (!) 97.5 F (36.4 C), temperature source Oral, resp. rate (!) 21, SpO2 94 %. General appearance: cachectic and mild distress Head: Normocephalic, without obvious abnormality, atraumatic Eyes: Pupils are equal, round, reactive to light. Extraocular motion is intact.  Ears: Examination of the ears shows normal auricles and external auditory canals bilaterally.  Nose: Nasal examination shows normal mucosa, septum, turbinates.  Face: Facial examination shows no asymmetry. Palpation of the face elicit no significant tenderness.  Mouth: Oral cavity examination shows blood clots in pharynx. No significant trismus is noted.  Neck: Trach tube in place. Bleeding noted from the trach lumen. No bleeding from the trach stoma.  Procedure:  Flexible Fiberoptic Laryngoscopy and tracheoscopy Anesthesia: None Indication:  Persistent throat pain. Description: The patient gave oral consent to proceed. The flexible scope was inserted into the right nasal cavity and advanced towards the nasopharynx.  Visualized mucosa over the turbinates and septum were normal.  The nasopharynx was clear.  Oropharyngeal cavity with a large amount of blood clots. After suctioning, diffuse oozing is noted from the supraglottic tumor.  This is likely the source of the bleeding. The flexible scope is withdrawn and reinserted through the trach stoma into the trachea. No bleeding source is noted from the trachea or mainstem bronchi.   Assessment/Plan: Diffuse bleeding from the supraglottic tumor. No bleeding source is noted from the trach stoma or the trachea. - The findings are discussed with the ER  physician.  - ? palliative radiation or embolization. The effectiveness is uncertain. - May need to be transferred to his primary oncology team at [facility].  Briggitte Boline W Kehinde Bowdish 09/25/2020, 3:04 PM

## 2020-09-25 NOTE — ED Notes (Signed)
ED TO INPATIENT HANDOFF REPORT  Name/Age/Gender Paul Lowery 69 y.o. male  Code Status    Code Status Orders  (From admission, onward)         Start     Ordered   09/25/20 1009  Do not attempt resuscitation (DNR)  Continuous       Question Answer Comment  In the event of cardiac or respiratory ARREST Do not call a "code blue"   In the event of cardiac or respiratory ARREST Do not perform Intubation, CPR, defibrillation or ACLS   In the event of cardiac or respiratory ARREST Use medication by any route, position, wound care, and other measures to relive pain and suffering. May use oxygen, suction and manual treatment of airway obstruction as needed for comfort.      09/25/20 1008        Code Status History    Date Active Date Inactive Code Status Order ID Comments User Context   06/24/2020 1927 06/28/2020 2214 Full Code [id number]  Lequita Halt, MD ED   Advance Care Planning Activity      Home/SNF/Other Skilled nursing facility  Chief Complaint Symptomatic anemia [D64.9]  Level of Care/Admitting Diagnosis ED Disposition    ED Disposition Condition [place]: [facility] [100102]  Level of Care: Stepdown [14]  Admit to SDU based on following criteria: Respiratory Distress:  Frequent assessment and/or intervention to maintain adequate ventilation/respiration, pulmonary toilet, and respiratory treatment.  May admit patient to [facility] or [facility] if equivalent level of care is available:: Yes  Covid Evaluation: Asymptomatic Screening Protocol (No Symptoms)  Diagnosis: Symptomatic anemia [7017793]  Admitting Physician: Harold Hedge [[id number]]  Attending Physician: Harold Hedge [[id number]]  Estimated length of stay: past midnight tomorrow  Certification:: I certify this patient will need inpatient services for at least 2 midnights       Medical History Past Medical History:  Diagnosis Date  . Anemia   . Cancer ([place])    . Coronary artery disease   . Hypertension   . Tracheostomy dependent ([place])     Allergies No Known Allergies  IV Location/Drains/Wounds Patient Lines/Drains/Airways Status    Active Line/Drains/Airways    Name Placement date Placement time Site Days   Peripheral IV 09/25/20 Left Hand 09/25/20  0810  Hand  less than 1   Gastrostomy/Enterostomy RLQ --  --  RLQ  --   Incision (Closed) 06/25/20 Hip Right 06/25/20  1740  -- 92   Tracheostomy [place] XLT Proximal 6 mm Cuffed --  --  6 mm  --   Tracheostomy [place] XLT Proximal 6 mm Cuffed --  --  6 mm  --   Tracheostomy [place] XLT Proximal Laryngectomy;Cuffed --  --  --  --          Labs/Imaging Results for orders placed or performed during the hospital encounter of 09/25/20 (from the past 48 hour(s))  CBC     Status: Abnormal   Collection Time: 09/25/20  8:09 AM  Result Value Ref Range   WBC 9.9 4.0 - 10.5 K/uL   RBC 2.61 (L) 4.22 - 5.81 MIL/uL   Hemoglobin 6.6 (LL) 13.0 - 17.0 g/dL    Comment: This critical result has verified and been called to [facility] by Rande Brunt on 12 18 2021 at [phone], and has been read back. CRITICAL RESULT VERIFIED   HCT 21.2 (L) 39.0 - 52.0 %   MCV 81.2 80.0 - 100.0  fL   MCH 25.3 (L) 26.0 - 34.0 pg   MCHC 31.1 30.0 - 36.0 g/dL   RDW 18.2 (H) 11.5 - 15.5 %   Platelets 306 150 - 400 K/uL   nRBC 0.0 0.0 - 0.2 %    Comment: Performed at [facility], [place] [address], [place], [place] [address]  Basic metabolic panel     Status: Abnormal   Collection Time: 09/25/20  8:09 AM  Result Value Ref Range   Sodium 133 (L) 135 - 145 mmol/L   Potassium 4.3 3.5 - 5.1 mmol/L   Chloride 94 (L) 98 - 111 mmol/L   CO2 30 22 - 32 mmol/L   Glucose, Bld 107 (H) 70 - 99 mg/dL    Comment: Glucose reference range applies only to samples taken after fasting for at least 8 hours.   BUN 31 (H) 8 - 23 mg/dL   Creatinine, Ser 0.89 0.61 - 1.24 mg/dL   Calcium 8.6 (L) 8.9 - 10.3 mg/dL   GFR, Estimated >60  >60 mL/min    Comment: (NOTE) Calculated using the CKD-EPI Creatinine Equation (2021)    Anion gap 9 5 - 15    Comment: Performed at [facility], [place] [address], [place], [place] [address]  Type and screen     Status: None (Preliminary result)   Collection Time: 09/25/20  8:58 AM  Result Value Ref Range   ABO/RH(D) O POS    Antibody Screen NEG    Sample Expiration 09/28/2020,2359    Unit Number [id number]    Blood Component Type RED CELLS,LR    Unit division 00    Status of Unit ISSUED    Transfusion Status OK TO TRANSFUSE    Crossmatch Result Compatible    Unit Number [id number]    Blood Component Type RED CELLS,LR    Unit division 00    Status of Unit ISSUED    Transfusion Status OK TO TRANSFUSE    Crossmatch Result      Compatible Performed at [facility], [place] [address] [place], [place] [address]   Prepare RBC (crossmatch)     Status: None   Collection Time: 09/25/20  8:58 AM  Result Value Ref Range   Order Confirmation      ORDER PROCESSED BY BLOOD BANK Performed at [facility], [place] [address], [place], [place] [address]   Resp Panel by RT-PCR (Flu A&B, Covid) Nasopharyngeal Swab     Status: None   Collection Time: 09/25/20 10:50 AM   Specimen: Nasopharyngeal Swab; Nasopharyngeal(NP) swabs in vial transport medium  Result Value Ref Range   SARS Coronavirus 2 by RT PCR NEGATIVE NEGATIVE    Comment: (NOTE) SARS-CoV-2 target nucleic acids are NOT DETECTED.  The SARS-CoV-2 RNA is generally detectable in upper respiratory specimens during the acute phase of infection. The lowest concentration of SARS-CoV-2 viral copies this assay can detect is 138 copies/mL. A negative result does not preclude SARS-Cov-2 infection and should not be used as the sole basis for treatment or other patient management decisions. A negative result may occur with  improper specimen collection/handling, submission of specimen  other than nasopharyngeal swab, presence of viral mutation(s) within the areas targeted by this assay, and inadequate number of viral copies(<138 copies/mL). A negative result must be combined with clinical observations, patient history, and epidemiological information. The expected result is Negative.  Fact Sheet for Patients:  EntrepreneurPulse.com.au  Fact Sheet for Healthcare Providers:  IncredibleEmployment.be  This test is no t yet approved or cleared by the [place] and  has been authorized for detection and/or diagnosis of SARS-CoV-2 by FDA under an Emergency Use Authorization (EUA). This EUA will remain  in effect (meaning this test can be used) for the duration of the COVID-19 declaration under Section 564(b)(1) of the Act, 21 U.S.C.section 360bbb-3(b)(1), unless the authorization is terminated  or revoked sooner.       Influenza A by PCR NEGATIVE NEGATIVE   Influenza B by PCR NEGATIVE NEGATIVE    Comment: (NOTE) The Xpert Xpress SARS-CoV-2/FLU/RSV plus assay is intended as an aid in the diagnosis of influenza from Nasopharyngeal swab specimens and should not be used as a sole basis for treatment. Nasal washings and aspirates are unacceptable for Xpert Xpress SARS-CoV-2/FLU/RSV testing.  Fact Sheet for Patients: EntrepreneurPulse.com.au  Fact Sheet for Healthcare Providers: IncredibleEmployment.be  This test is not yet approved or cleared by the [place] FDA and has been authorized for detection and/or diagnosis of SARS-CoV-2 by FDA under an Emergency Use Authorization (EUA). This EUA will remain in effect (meaning this test can be used) for the duration of the COVID-19 declaration under Section 564(b)(1) of the Act, 21 U.S.C. section 360bbb-3(b)(1), unless the authorization is terminated or revoked.  Performed at [facility], [place] [address], [place], [place] [address]    DG CHEST PORT 1 VIEW  Result Date: 09/25/2020 CLINICAL DATA:  Bloody sputum.  Tracheostomy. EXAM: PORTABLE CHEST 1 VIEW COMPARISON:  07/21/2020 FINDINGS: Tracheostomy tube is seen in expected position. Prior CABG again noted. Cardiomegaly and pulmonary vascular congestion remains stable. New opacity is seen in the medial left lower lobe, suspicious for pneumonia. Right lung is clear. No evidence of pleural effusion. IMPRESSION: New opacity in medial left lower lobe, suspicious for pneumonia. Stable cardiomegaly and pulmonary vascular congestion. Electronically Signed   By: Marlaine Hind M.D.   On: 09/25/2020 11:43    Pending Labs Unresulted Labs (From admission, onward)          Start     Ordered   09/26/20 7622  Basic metabolic panel  Daily,   R      09/25/20 1008   09/26/20 0500  CBC  Daily,   R      09/25/20 1008   09/25/20 1551  Hemoglobin and hematocrit, blood  Now then every 6 hours,   R (with STAT occurrences)     Question:  Specimen collection method  Answer:  Lab=Lab collect   09/25/20 1550          Vitals/Pain Today's Vitals   09/25/20 1600 09/25/20 1700 09/25/20 1730 09/25/20 1800  BP: (!) 142/113 132/83 (!) 144/83 120/76  Pulse: (!) 113 93 (!) 112 98  Resp: (!) 25 20 15  (!) 24  Temp:      TempSrc:      SpO2: 95% 95% 92% 92%  PainSc:        Isolation Precautions No active isolations  Medications Medications  acetaminophen (TYLENOL) tablet 975 mg (975 mg Per Tube Given 09/25/20 1738)  HYDROmorphone (DILAUDID) tablet 2 mg (has no administration in time range)  oxyCODONE (Oxy IR/ROXICODONE) immediate release tablet 5 mg (5 mg Per Tube Given 09/25/20 1739)  atorvastatin (LIPITOR) tablet 80 mg (80 mg Per Tube Given 09/25/20 1319)  carvedilol (COREG) tablet 25 mg (25 mg Per Tube Given 09/25/20 1319)  furosemide (LASIX) tablet 80 mg (80 mg Per Tube Given 09/25/20 1319)  polyethylene glycol (MIRALAX /  GLYCOLAX) packet 17 g (has no  administration in time range)  senna-docusate (Senokot-S) tablet 1 tablet (1 tablet Per Tube Given 09/25/20 1319)  melatonin tablet 3 mg (has no administration in time range)  budesonide (PULMICORT) nebulizer solution 0.5 mg (0.5 mg Nebulization Given 09/25/20 1036)  fluticasone (FLONASE) 50 MCG/ACT nasal spray 1 spray (has no administration in time range)  guaiFENesin (ROBITUSSIN) 100 MG/5ML solution 200 mg (200 mg Per Tube Given 09/25/20 1320)  ipratropium-albuterol (DUONEB) 0.5-2.5 (3) MG/3ML nebulizer solution 3 mL (3 mLs Nebulization Given 09/25/20 1830)  loratadine (CLARITIN) tablet 10 mg (10 mg Per Tube Given 09/25/20 1319)  sodium chloride flush (NS) 0.9 % injection 3 mL (3 mLs Intravenous Given 09/25/20 1038)  cefTRIAXone (ROCEPHIN) 2 g in sodium chloride 0.9 % 100 mL IVPB (0 g Intravenous Stopped 09/25/20 1410)  doxycycline (VIBRA-TABS) tablet 100 mg (100 mg Per Tube Given 09/25/20 1324)  0.9 %  sodium chloride infusion (0 mL/hr Intravenous Stopped 09/25/20 1854)    Mobility walks with device

## 2020-09-25 NOTE — Progress Notes (Signed)
RT has been in patients room multiple times to suction thick bright red blood from trach. No distress noted. Patient complaining of SOB.

## 2020-09-25 NOTE — Consult Note (Addendum)
Consultation Note Date: 09/25/2020   Patient Name: Paul Lowery  DOB: 12/22/1950  MRN: [id number]  Age / Sex: 69 y.o., male  PCP: Benito Mccreedy, MD Referring Physician: Harold Hedge, MD  Reason for Consultation: Establishing goals of care  HPI/Patient Profile: 69 y.o. 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  He presented to the [facility], ER from [facility] due to bleeding from his trach.  Current hemoglobin is 6.6.  Review of EPIC notes indicates he is followed at [facility] for his supraglottic cancer.  Physician consult on June 16, 2020 indicated that the patient is not a candidate for systemic therapy due to frailty.  The physician had a long conversation with his son and daughter as well as the patient.  Patient indicated his priorities were spending time with his family and being comfortable.  He trusts in [place].  The family expressed at that time that they did not have a trusting relationship with hospice.  They felt hospice hastened the death of a family member.  Clinical Assessment and Goals of Care:  I have reviewed medical records including EPIC notes, labs and imaging, received report from the ER physician, examined the patient and attempted to speak with him at bedside.  It is obvious that Paul Lowery is alert and oriented.  He seems very aware of his condition and what is going on around him.  Unfortunately he is coughing up significant amounts of blood through his trach.  He tells me he feels like he is choking.  He is very uncomfortable.  The ER physician and nurse are at bedside as well attempting to attend to the patient.  It was  not a good time for an in-depth palliative conversation.  I did ask Paul Lowery two questions:  (1) who would be his surrogate decision maker if he is unable to represent himself?  He responded "Paul Lowery"; and (2) if the worst were to happen and his heart were to stop, and he passed, would he want [place] to aggressively resuscitate him and put him on life support?  Mr. Laster clearly shook his head "no".  I explained to Paul Lowery that we would continue to support him medically.  The DNR does not change his treatment plan.  The DNR simply stops [place] from aggressively resuscitating him if he suffers arrest.  I committed to call his daughter Paul Lowery to let her know he was in the ER and that he had changed his CODE STATUS to DNR.  A few minutes later I spoke to [facility].  I told her her father was in the ER and explained his condition.  I then told her about our [place] conversation and his response.  Paul Lowery stated that she understood, and that unfortunately she has been through the situation multiple times before.  I expressed my sympathy for her previous losses and assured her that we would do everything possible to make her father feel better. Questions and concerns were addressed.  The family was encouraged to call with questions or concerns.   Primary Decision Maker:  PATIENT    SUMMARY OF RECOMMENDATIONS    Recommend palliative care to follow at [facility].  Code Status/Advance Care Planning:  Changed to DNR, with full scope treatment.  If he arrests do not attempt aggressive resuscitation.  However if he is declining please transport to the hospital and treat as appropriate.  [name] DNR form completed and placed on the chart   Symptom Management:   Per ER physician.    As he has had a considerable amount of bleeding would consider discontinuation of anticoagulation.  Additional Recommendations (Limitations, Scope, Preferences):  Full Scope Treatment  Palliative Prophylaxis:   Oral  Care  Psycho-social/Spiritual:   Desire for further Chaplaincy support: Patient would greatly appreciate chaplain support  Prognosis: Likely less than 6 months as he is not a candidate for systemic therapy    Discharge Planning: Return to [facility] with Palliative care to follow      Primary Diagnoses: Present on Admission: . Symptomatic anemia   I have reviewed the medical record, interviewed the patient and family, and examined the patient. The following aspects are pertinent.  Past Medical History:  Diagnosis Date  . Anemia   . Cancer ([place])   . Coronary artery disease   . Hypertension   . Tracheostomy dependent [facility])    Social History   Socioeconomic History  . Marital status: Unknown    Spouse name: Not on file  . Number of children: Not on file  . Years of education: Not on file  . Highest education level: Not on file  Occupational History  . Not on file  Tobacco Use  . Smoking status: Former Research scientist (life sciences)  . Smokeless tobacco: Never Used  Substance and Sexual Activity  . Alcohol use: Not on file  . Drug use: Not on file  . Sexual activity: Not on file  Other Topics Concern  . Not on file  Social History Narrative  . Not on file   Social Determinants of Health   Financial Resource Strain: Not on file  Food Insecurity: Not on file  Transportation Needs: Not on file  Physical Activity: Not on file  Stress: Not on file  Social Connections: Not on file   History reviewed. No pertinent family history.  No Known Allergies    Vital Signs: BP (!) 104/57   Pulse 77   Temp 97.7 F (36.5 C) (Oral)   Resp (!) 21   SpO2 95%  Pain Scale: 0-10   Pain Score: 0-No pain   SpO2: SpO2: 95 % O2 Device:SpO2: 95 % O2 Flow Rate: .O2 Flow Rate (L/min): 5 L/min    Palliative Assessment/Data:  20%     Time In: 9:15 Time Out: 10:20 Time Total: 65 min. Visit consisted of counseling and education dealing with the complex and emotionally intense issues  surrounding the need for palliative care and symptom management in the setting of serious and potentially life-threatening illness. Greater than 50%  of this time was spent counseling and coordinating care related to the above assessment and plan.  Signed by: Florentina Jenny, PA-C Palliative Medicine  Please contact Palliative Medicine Team phone at [phone] for questions and concerns.  For individual provider: See Shea Evans

## 2020-09-25 NOTE — ED Notes (Signed)
Date and time results received: 09/25/20 0847 (use smartphrase ".now" to insert current time)  Test: HGB Critical Value: 606  Name of Provider Notified: Delane Ginger  Orders Received? Or Actions Taken?: Provider notified

## 2020-09-25 NOTE — H&P (Signed)
History and Physical        Hospital Admission Note Date: 09/25/2020  Patient name: Paul Lowery Medical record number: [id number] Date of birth: 1950/11/02 Age: 69 y.o. Gender: male  PCP: Benito Mccreedy, MD  Patient coming from: [place]   Chief Complaint    Chief Complaint  Patient presents with  . Throat Bleeding      HPI:   This is a 69 year old male with past medical history of advanced supraglottic squamous cell cancer, chronic hypercapnic respiratory failure and COPD with chronic trach with chronic bleeding/oozing from trach site follows at [facility], G-tube, CAD s/p CABG, A. fib off Eliquis for the past year, chronic diastolic heart failure, recent right hip fracture s/p repair on 06/25/2020, chronic anemia who presents to the ED via EMS from [facility] after staff reported bleeding and blood clots from his trach site.  This is a chronic issue with recurrent wheezing and mild bleeding from the site with periodic exacerbations.  He denies any pain or soreness in the area.  He states that he believes he is still taking Lovenox for VTE prophylaxis from his recent hip fracture   Upon presentation to bedside, patient was in active respiratory distress and coughing up bright red blood with blood clots.  O2 sat hovered between mid to high 80s.  Patient was performing suction on his own and coughing.  Bedside nurse arrived at bedside and reported RT was consulted and on their way.  Eventually his hypoxia and bleeding improved but did not resolve and he was stabilized.  Palliative care NP was present and patient states that he is DNR.   ED Course: Afebrile, hemodynamically stable, Desaturation to mid to high 80s with bleeding as above which improved.  Tolerated 5 LPM per trach collar. Notable Labs: Sodium 133, K4.3, glucose 107, BUN 31, creatinine 0.89, WBC 9.9, Hb 6.6, platelets 306.   EDP consulted palliative care and ordered 2 units PRBCs which have not been transfused yet.   Vitals:   09/25/20 1045 09/25/20 1130  BP: (!) 133/56 123/67  Pulse: 77 (!) 106  Resp: 19 (!) 24  Temp: 97.9 F (36.6 C)   SpO2: 100% 91%     Review of Systems:  Review of Systems  All other systems reviewed and are negative.   Medical/Social/Family History   Past Medical History: Past Medical History:  Diagnosis Date  . Anemia   . Cancer ([place])   . Coronary artery disease   . Hypertension   . Tracheostomy dependent [facility])     Past Surgical History:  Procedure Laterality Date  . INTRAMEDULLARY (IM) NAIL INTERTROCHANTERIC Right 06/25/2020   Procedure: INTRAMEDULLARY (IM) NAIL INTERTROCHANTRIC;  Surgeon: Hiram Gash, MD;  Location: [facility];  Service: Orthopedics;  Laterality: Right;    Medications: Prior to Admission medications   Medication Sig Start Date End Date Taking? Authorizing Provider  acetaminophen (TYLENOL) 325 MG tablet Place 975 mg into feeding tube every 8 (eight) hours.   Yes [provider]  atorvastatin (LIPITOR) 80 MG tablet Place 80 mg into feeding tube daily.   Yes [provider]  budesonide (PULMICORT) 0.5 MG/2ML nebulizer solution Take 0.5 mg by nebulization every 12 (twelve) hours.  Yes [provider]  carvedilol (COREG) 25 MG tablet Place 25 mg into feeding tube every 12 (twelve) hours.   Yes [provider]  chlorhexidine (PERIDEX) 0.12 % solution Use as directed 5 mLs in the mouth or throat 2 (two) times daily.   Yes [provider]  Cholecalciferol (VITAMIN D3) 50 MCG (2000 UT) CHEW Place 2,000 Units into feeding tube at bedtime.   Yes [provider]  enoxaparin (LOVENOX) 40 MG/0.4ML injection Inject 0.4 mLs (40 mg total) into the skin daily. For DVT prophylaxis after surgery 06/28/20 07/28/20 Yes McBane, Maylene Roes, PA-C  fluticasone (FLONASE) 50 MCG/ACT nasal spray Place 1 spray into both nostrils  every 12 (twelve) hours as needed for allergies or rhinitis.   Yes [provider]  furosemide (LASIX) 80 MG tablet Place 80 mg into feeding tube daily.   Yes [provider]  guaiFENesin (ROBITUSSIN) 100 MG/5ML SOLN Place 10 mLs into feeding tube every 12 (twelve) hours.   Yes [provider]  HYDROmorphone (DILAUDID) 2 MG tablet Place 2 mg into feeding tube every 8 (eight) hours as needed for severe pain.   Yes [provider]  ipratropium-albuterol (DUONEB) 0.5-2.5 (3) MG/3ML SOLN Take 3 mLs by nebulization every 6 (six) hours.   Yes [provider]  lisinopril (ZESTRIL) 5 MG tablet Place 5 mg into feeding tube daily.   Yes [provider]  loratadine (CLARITIN) 10 MG tablet Place 10 mg into feeding tube daily.   Yes [provider]  LORazepam (ATIVAN) 1 MG tablet Place 1 mg into feeding tube every 6 (six) hours as needed for anxiety.   Yes [provider]  melatonin 3 MG TABS tablet Place 3 mg into feeding tube at bedtime.   Yes [provider]  Nutritional Supplements (FEEDING SUPPLEMENT, JEVITY 1.5 CAL,) LIQD Place 250 mLs into feeding tube every 4 (four) hours.   Yes [provider]  oxyCODONE (OXY IR/ROXICODONE) 5 MG immediate release tablet Take 1 pills every 6 hrs as needed for pain Patient taking differently: Place 5 mg into feeding tube every 6 (six) hours. 06/28/20  Yes McBane, Maylene Roes, PA-C  polyethylene glycol (MIRALAX / GLYCOLAX) 17 g packet Take 17 g by mouth daily as needed for mild constipation. Patient taking differently: Place 17 g into feeding tube daily as needed for mild constipation. 06/28/20  Yes Vashti Hey, MD  Racepinephrine HCl (S2, RACEPINEPHRINE,) 2.25 % NEBU nebulizer solution Take 0.5 mLs by nebulization every 6 (six) hours as needed (shortness of breath, wheezing). Give with 65mL iced saline   Yes [provider]  senna-docusate (SENOKOT-S) 8.6-50 MG  tablet Place 1 tablet into feeding tube daily.   Yes [provider]  sodium chloride (OCEAN) 0.65 % SOLN nasal spray Place 1 spray into both nostrils daily as needed for congestion.   Yes [provider]  Water For Irrigation, Sterile (FREE WATER) SOLN Place 175 mLs into feeding tube every 4 (four) hours. 06/28/20  Yes Vashti Hey, MD    Allergies:  No Known Allergies  Social History:  reports that he has quit smoking. He has never used smokeless tobacco. No history on file for alcohol use and drug use.  Family History: History reviewed. No pertinent family history.   Objective   Physical Exam: Blood pressure 123/67, pulse (!) 106, temperature 97.9 F (36.6 C), temperature source Oral, resp. rate (!) 24, SpO2 91 %.  Physical Exam Vitals and nursing note reviewed. Exam  conducted with a chaperone present.  Constitutional:      General: He is in acute distress.  HENT:     Head: Normocephalic.     Mouth/Throat:     Comments: Dried blood and suctioning up blood clots Eyes:     Conjunctiva/sclera: Conjunctivae normal.  Neck:     Comments: Trach collar with bright red blood and blood clots Cardiovascular:     Rate and Rhythm: Normal rate and regular rhythm.  Pulmonary:     Effort: Pulmonary effort is normal.     Breath sounds: No wheezing.  Abdominal:     General: Abdomen is flat.  Musculoskeletal:        General: No swelling or tenderness.  Skin:    General: Skin is warm.     Coloration: Skin is not jaundiced.  Neurological:     Mental Status: He is alert. Mental status is at baseline.  Psychiatric:        Mood and Affect: Mood normal.        Behavior: Behavior normal.     LABS on Admission: I have personally reviewed all the labs and imaging below     Basic Metabolic Panel: Recent Labs  Lab 09/25/20 0809  NA 133*  K 4.3  CL 94*  CO2 30  GLUCOSE 107*  BUN 31*  CREATININE 0.89  CALCIUM 8.6*   Liver Function Tests: No  results for input(s): AST, ALT, ALKPHOS, BILITOT, PROT, ALBUMIN in the last 168 hours. No results for input(s): LIPASE, AMYLASE in the last 168 hours. No results for input(s): AMMONIA in the last 168 hours. CBC: Recent Labs  Lab 09/25/20 0809  WBC 9.9  HGB 6.6*  HCT 21.2*  MCV 81.2  PLT 306   Cardiac Enzymes: No results for input(s): CKTOTAL, CKMB, CKMBINDEX, TROPONINI in the last 168 hours. BNP: Invalid input(s): POCBNP CBG: No results for input(s): GLUCAP in the last 168 hours.  Radiological Exams on Admission:  DG CHEST PORT 1 VIEW  Result Date: 09/25/2020 CLINICAL DATA:  Bloody sputum.  Tracheostomy. EXAM: PORTABLE CHEST 1 VIEW COMPARISON:  07/21/2020 FINDINGS: Tracheostomy tube is seen in expected position. Prior CABG again noted. Cardiomegaly and pulmonary vascular congestion remains stable. New opacity is seen in the medial left lower lobe, suspicious for pneumonia. Right lung is clear. No evidence of pleural effusion. IMPRESSION: New opacity in medial left lower lobe, suspicious for pneumonia. Stable cardiomegaly and pulmonary vascular congestion. Electronically Signed   By: Marlaine Hind M.D.   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 Symptomatic anemia secondary to acute on chronic bleeding from trach site a. BP soft but stable b. Hb 6.6, PRBC transfusion pending c. Has Lovenox on his med list for VTE prophylaxis from his right hip surgery. Attempted to confirm this with facility who did not answer. Will try again later d. Hold Lovenox e. Called Dr. Benjamine Mola, ENT, who will see the patient today  2. Acute hypoxic respiratory failure in the setting of chronic  hypercapnic respiratory failure with tracheostomy secondary to secretions/blood clots and possible CAP a. RT consulted b. Continue home nebs c. CXR concerning for new opacity in  left lower lobe - start Ceftriaxone/Doxycycline d. Plan as above  3. Advanced supraglottic squamous cell cancer a. Follows at [facility] b. Palliative care consulted, patient made DNR  4. Hypertension a. Continue home carvedilol b. Hold home lisinopril  5. CAD s/p CABG a. Continue beta-blocker and statin    DVT prophylaxis: SCDs   Code Status: DNR  Diet: N.p.o. Family Communication: Admission, patients condition and plan of care including tests being ordered have been discussed with the patient who indicates understanding and agrees with the plan and Code Status. Patient's daughter was updated  Disposition Plan: The appropriate patient status for this patient is INPATIENT. Inpatient status is judged to be reasonable and necessary in order to provide the required intensity of service to ensure the patient's safety. The patient's presenting symptoms, physical exam findings, and initial radiographic and laboratory data in the context of their chronic comorbidities is felt to place them at high risk for further clinical deterioration. Furthermore, it is not anticipated that the patient will be medically stable for discharge from the hospital within 2 midnights of admission. The following factors support the patient status of inpatient.   " The patient's presenting symptoms include shortness of breath, fatigue, bleeding. " The worrisome physical exam findings include bleeding, respiratory distress. " The initial radiographic and laboratory data are worrisome because of pneumonia. " The chronic co-morbidities include trach, copd, G tube.   * I certify that at the point of admission it is my clinical judgment that the patient will require inpatient hospital care spanning beyond 2 midnights from the point of admission due to  high intensity of service, high risk for further deterioration and high frequency of surveillance required.*   Status is: Inpatient  Remains inpatient appropriate because:Ongoing diagnostic testing needed not appropriate for outpatient work up, IV treatments appropriate due to intensity of illness or inability to take PO and Inpatient level of care appropriate due to severity of illness   Dispo: The patient is from: LTAC              Anticipated d/c is to: LTAC              Anticipated d/c date is: > 3 days              Patient currently is not medically stable to d/c.         The medical decision making on this patient was of high complexity and the patient is at high risk for clinical deterioration, therefore this is a level 3 admission.  Consultants  . ENT . Palliative  Procedures  . none  Time Spent on Admission: 80 minutes    Harold Hedge, DO [facility]  09/25/2020, 12:03 PM

## 2020-09-25 NOTE — ED Notes (Addendum)
RT at bedside.

## 2020-09-25 NOTE — ED Triage Notes (Signed)
EMS reports from [facility], staff reports noting blood clots and some bleeding from [place]. Hx of malig neoplasm of supraglottis. Seen prior in Oct. for same which resolved. Pt in no respiratory distress. Pt on 10ltrs 24-7. EMS notes Pt coughing blood through trach and mouth.  BP 128/73 HR 72 RR 18 Sp02 10ltrs Trach collar Temp 97.9

## 2020-09-26 LAB — CBC
HCT: 22 % — ABNORMAL LOW (ref 39.0–52.0)
HCT: 25 % — ABNORMAL LOW (ref 39.0–52.0)
Hemoglobin: 7 g/dL — ABNORMAL LOW (ref 13.0–17.0)
Hemoglobin: 8.2 g/dL — ABNORMAL LOW (ref 13.0–17.0)
MCH: 25.6 pg — ABNORMAL LOW (ref 26.0–34.0)
MCH: 26.2 pg (ref 26.0–34.0)
MCHC: 31.8 g/dL (ref 30.0–36.0)
MCHC: 32.8 g/dL (ref 30.0–36.0)
MCV: 80.6 fL (ref 80.0–100.0)
MCV: 79.9 fL — ABNORMAL LOW (ref 80.0–100.0)
Platelets: 291 10*3/uL (ref 150–400)
Platelets: 226 10*3/uL (ref 150–400)
RBC: 2.73 MIL/uL — ABNORMAL LOW (ref 4.22–5.81)
RBC: 3.13 MIL/uL — ABNORMAL LOW (ref 4.22–5.81)
RDW: 17.5 % — ABNORMAL HIGH (ref 11.5–15.5)
RDW: 17.2 % — ABNORMAL HIGH (ref 11.5–15.5)
WBC: 8.8 10*3/uL (ref 4.0–10.5)
WBC: 8.8 10*3/uL (ref 4.0–10.5)
nRBC: 0 % (ref 0.0–0.2)
nRBC: 0 % (ref 0.0–0.2)

## 2020-09-26 LAB — BASIC METABOLIC PANEL
Anion gap: 11 (ref 5–15)
BUN: 33 mg/dL — ABNORMAL HIGH (ref 8–23)
CO2: 29 mmol/L (ref 22–32)
Calcium: 8.6 mg/dL — ABNORMAL LOW (ref 8.9–10.3)
Chloride: 96 mmol/L — ABNORMAL LOW (ref 98–111)
Creatinine, Ser: 0.91 mg/dL (ref 0.61–1.24)
GFR, Estimated: >60 mL/min (ref >60)
Glucose, Bld: 81 mg/dL (ref 70–99)
Potassium: 4 mmol/L (ref 3.5–5.1)
Sodium: 136 mmol/L (ref 135–145)

## 2020-09-26 LAB — HEMOGLOBIN AND HEMATOCRIT, BLOOD
HCT: 26.6 % — ABNORMAL LOW (ref 39.0–52.0)
Hemoglobin: 8.4 g/dL — ABNORMAL LOW (ref 13.0–17.0)

## 2020-09-26 LAB — PREPARE RBC (CROSSMATCH)

## 2020-09-26 MED ORDER — SODIUM CHLORIDE 0.9% IV SOLUTION: Freq: Once | INTRAVENOUS | Status: AC

## 2020-09-26 MED ORDER — CHLORHEXIDINE GLUCONATE 0.12 % MT SOLN
15.0000 mL | Freq: Two times a day (BID) | OROMUCOSAL | Status: DC
  Administered 2020-09-26 – 2020-09-28 (×5): 15 mL via OROMUCOSAL
  Filled 2020-09-26 (×5): qty 15

## 2020-09-26 MED ORDER — SODIUM CHLORIDE 0.9 % IV SOLN
2.0000 g | Freq: Three times a day (TID) | INTRAVENOUS | Status: DC
  Administered 2020-09-26 – 2020-09-28 (×7): 2 g via INTRAVENOUS
  Filled 2020-09-26 (×7): qty 2

## 2020-09-26 MED ORDER — ORAL CARE MOUTH RINSE
15.0000 mL | Freq: Two times a day (BID) | OROMUCOSAL | Status: DC
  Administered 2020-09-26 – 2020-09-28 (×5): 15 mL via OROMUCOSAL

## 2020-09-26 NOTE — Progress Notes (Signed)
PROGRESS NOTE    Paul Lowery  QMV:[id number] DOB: 1951/01/07 DOA: 09/25/2020 PCP: Benito Mccreedy, MD   Brief Narrative: HPI per Dr. Neysa Bonito on 09/25/2020  69 year old male with past medical history of advanced supraglottic squamous cell cancer, chronic hypercapnic respiratory failure and COPD with chronic trach with chronic bleeding/oozing from trach site follows at [facility], G-tube, CAD s/p CABG, A. fib off Eliquis for the past year, chronic diastolic heart failure, recent right hip fracture s/p repair on 06/25/2020, chronic anemia who presents to the ED via EMS from [facility] after staff reported bleeding and blood clots from his trach site.  This is a chronic issue with recurrent wheezing and mild bleeding from the site with periodic exacerbations.  He denies any pain or soreness in the area.  He states that he believes he is still taking Lovenox for VTE prophylaxis from his recent hip fracture   Upon presentation to bedside, patient was in active respiratory distress and coughing up bright red blood with blood clots.  O2 sat hovered between mid to high 80s.  Patient was performing suction on his own and coughing.  Bedside nurse arrived at bedside and reported RT was consulted and on their way.  Eventually his hypoxia and bleeding improved but did not resolve and he was stabilized.  Palliative care NP was present and patient states that he is DNR.   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  He was placed on 5 L of oxygen.  Presently he is saturating 95%  on 5 L of oxygen. Chest x-ray showed new infiltrate.  Patient comes from long-term care facility.  We will change his antibiotics to cefepime and doxycycline.  #2 advanced supraglottic squamous cell cancer followed at [facility].  #3 symptomatic anemia secondary to bleeding from the trach site patient received a unit of blood transfusion yesterday.  Hemoglobin 7 today trending down.  Will transfuse another unit today and monitor closely.  Appreciate ENT input.  Status post flexible fiberoptic laryngoscopy and tracheoscopy.  Diffuse bleeding noted from the supraglottic tumor no bleeding identified from the trach stoma over the trachea.  Follow-up labs after blood transfusion and in a.m.  #4 history of essential hypertension holding lisinopril continue carvedilol  #5 history of CAD and CABG on statin and beta-blocker  #6 goals of care patient is DNR.  With chronic multiple comorbidities and decreased functional status I agree with DNR.  Prognosis is poor.   Estimated body mass index is 26.73 kg/m as calculated from the following:   Height as of this encounter: 6\' 3"  (1.905 m).   Weight as of this encounter: 97 kg.  DVT prophylaxis: SCD Code Status: DNR Family Communication: None at bedside disposition Plan:  Status is: Inpatient  Dispo: The patient is from: SNF              Anticipated d/c is to: LTAC              Anticipated d/c date is: 2 days              Patient currently is  not medically stable to d/c.   Consultants: ENT and palliative care  Procedures: Status post flexible laryngoscopy and tracheoscopy 09/25/2020. Antimicrobials: Cefepime and doxycycline  Subjective:  He is resting in bed he is awake alert answering questions appropriately by nodding his head   Objective: Vitals:   09/26/20 0900 09/26/20 1100 09/26/20 1133 09/26/20 1236  BP: (!) 102/42 (!) 128/58  132/62  Pulse: 84 79  79  Resp: 17 (!) 7 16 10   Temp:    98.5 F (36.9 C)  TempSrc:    Oral  SpO2: 93% 94% 94%  95%  Weight:      Height:        Intake/Output Summary (Last 24 hours) at 09/26/2020 1253 Last data filed at 09/26/2020 1236 Gross per 24 hour  Intake 1575 ml  Output 900 ml  Net 675 ml   Filed Weights   09/25/20 2100  Weight: 97 kg    Examination:  General exam: Appears in nad Respiratory system: Rhonchi bilaterally to auscultation. Respiratory effort normal.  Trach in place with no evidence of active bleeding presently.   Cardiovascular system: S1 & S2 heard, RRR. No JVD, murmurs, rubs, gallops or clicks. No pedal edema. Gastrointestinal system: Abdomen is nondistended, soft and nontender. No organomegaly or masses felt. Normal bowel sounds heard.  PEG in place. Central nervous system: Alert and oriented. No focal neurological deficits. Extremities: 1+ bilateral pitting edema. Skin: No rashes, lesions or ulcers Psychiatry: Judgement and insight appear normal. Mood & affect appropriate.     Data Reviewed: I have personally reviewed following labs and imaging studies  CBC: Recent Labs  Lab 09/25/20 0809 09/25/20 2121 09/26/20 0210  WBC 9.9  --  8.8  HGB 6.6* 7.6* 7.0*  HCT 21.2* 23.3* 22.0*  MCV 81.2  --  80.6  PLT 306  --  226   Basic Metabolic Panel: Recent Labs  Lab 09/25/20 0809 09/26/20 0210  NA 133* 136  K 4.3 4.0  CL 94* 96*  CO2 30 29  GLUCOSE 107* 81  BUN 31* 33*  CREATININE 0.89 0.91  CALCIUM 8.6* 8.6*   GFR: Estimated Creatinine Clearance: 90.3 mL/min (by C-G formula based on SCr of 0.91 mg/dL). Liver Function Tests: No results for input(s): AST, ALT, ALKPHOS, BILITOT, PROT, ALBUMIN in the last 168 hours. No results for input(s): LIPASE, AMYLASE in the last 168 hours. No results for input(s): AMMONIA in the last 168 hours. Coagulation Profile: No results for input(s): INR, PROTIME in the last 168 hours. Cardiac Enzymes: No results for input(s): CKTOTAL, CKMB, CKMBINDEX, TROPONINI in the last 168 hours. BNP (last 3 results) No results for  input(s): PROBNP in the last 8760 hours. HbA1C: No results for input(s): HGBA1C in the last 72 hours. CBG: No results for input(s): GLUCAP in the last 168 hours. Lipid Profile: No results for input(s): CHOL, HDL, LDLCALC, TRIG, CHOLHDL, LDLDIRECT in the last 72 hours. Thyroid Function Tests: No results for input(s): TSH, T4TOTAL, FREET4, T3FREE, THYROIDAB in the last 72 hours. Anemia Panel: No results for input(s): VITAMINB12, FOLATE, FERRITIN, TIBC, IRON, RETICCTPCT in the last 72 hours. Sepsis Labs: No results for input(s): PROCALCITON, LATICACIDVEN in the last 168 hours.  Recent Results (from the past 240 hour(s))  Resp Panel by RT-PCR (Flu A&B, Covid) Nasopharyngeal Swab     Status: None   Collection Time: 09/25/20 10:50 AM   Specimen: Nasopharyngeal Swab; Nasopharyngeal(NP) swabs in vial transport medium  Result Value Ref Range Status   SARS Coronavirus 2  by RT PCR NEGATIVE NEGATIVE Final    Comment: (NOTE) SARS-CoV-2 target nucleic acids are NOT DETECTED.  The SARS-CoV-2 RNA is generally detectable in upper respiratory specimens during the acute phase of infection. The lowest concentration of SARS-CoV-2 viral copies this assay can detect is 138 copies/mL. A negative result does not preclude SARS-Cov-2 infection and should not be used as the sole basis for treatment or other patient management decisions. A negative result may occur with  improper specimen collection/handling, submission of specimen other than nasopharyngeal swab, presence of viral mutation(s) within the areas targeted by this assay, and inadequate number of viral copies(<138 copies/mL). A negative result must be combined with clinical observations, patient history, and epidemiological information. The expected result is Negative.  Fact Sheet for Patients:  EntrepreneurPulse.com.au  Fact Sheet for Healthcare Providers:  IncredibleEmployment.be  This test is no t yet  approved or cleared by the [place] FDA and  has been authorized for detection and/or diagnosis of SARS-CoV-2 by FDA under an Emergency Use Authorization (EUA). This EUA will remain  in effect (meaning this test can be used) for the duration of the COVID-19 declaration under Section 564(b)(1) of the Act, 21 U.S.C.section 360bbb-3(b)(1), unless the authorization is terminated  or revoked sooner.       Influenza A by PCR NEGATIVE NEGATIVE Final   Influenza B by PCR NEGATIVE NEGATIVE Final    Comment: (NOTE) The Xpert Xpress SARS-CoV-2/FLU/RSV plus assay is intended as an aid in the diagnosis of influenza from Nasopharyngeal swab specimens and should not be used as a sole basis for treatment. Nasal washings and aspirates are unacceptable for Xpert Xpress SARS-CoV-2/FLU/RSV testing.  Fact Sheet for Patients: EntrepreneurPulse.com.au  Fact Sheet for Healthcare Providers: IncredibleEmployment.be  This test is not yet approved or cleared by the [place] FDA and has been authorized for detection and/or diagnosis of SARS-CoV-2 by FDA under an Emergency Use Authorization (EUA). This EUA will remain in effect (meaning this test can be used) for the duration of the COVID-19 declaration under Section 564(b)(1) of the Act, 21 U.S.C. section 360bbb-3(b)(1), unless the authorization is terminated or revoked.  Performed at [facility], [place] [address], [place], [place] [address]   MRSA PCR Screening     Status: None   Collection Time: 09/25/20  8:19 PM   Specimen: Nasopharyngeal  Result Value Ref Range Status   MRSA by PCR NEGATIVE NEGATIVE Final    Comment:        The GeneXpert MRSA Assay (FDA approved for NASAL specimens only), is one component of a comprehensive MRSA colonization surveillance program. It is not intended to diagnose MRSA infection nor to guide or monitor treatment for MRSA infections. Performed at  [facility], [place] [address], [place], [place] [address]          Radiology Studies: DG CHEST PORT 1 VIEW  Result Date: 09/25/2020 CLINICAL DATA:  Bloody sputum.  Tracheostomy. EXAM: PORTABLE CHEST 1 VIEW COMPARISON:  07/21/2020 FINDINGS: Tracheostomy tube is seen in expected position. Prior CABG again noted. Cardiomegaly and pulmonary vascular congestion remains stable. New opacity is seen in the medial left lower lobe, suspicious for pneumonia. Right lung is clear. No evidence of pleural effusion. IMPRESSION: New opacity in medial left lower lobe, suspicious for pneumonia. Stable cardiomegaly and pulmonary vascular congestion. Electronically Signed   By: Marlaine Hind M.D.   On: 09/25/2020 11:43        Scheduled Meds: . acetaminophen  975 mg Per  Tube Q8H  . atorvastatin  80 mg Per Tube Daily  . budesonide  0.5 mg Nebulization Q12H  . carvedilol  25 mg Per Tube BID  . chlorhexidine  15 mL Mouth Rinse BID  . Chlorhexidine Gluconate Cloth  6 each Topical Daily  . doxycycline  100 mg Per Tube Q12H  . furosemide  80 mg Per Tube Daily  . guaiFENesin  10 mL Per Tube Q12H  . ipratropium-albuterol  3 mL Nebulization Q6H  . loratadine  10 mg Per Tube Daily  . mouth rinse  15 mL Mouth Rinse q12n4p  . melatonin  3 mg Per Tube QHS  . oxyCODONE  5 mg Per Tube Q6H  . senna-docusate  1 tablet Per Tube Daily  . sodium chloride flush  3 mL Intravenous Q12H   Continuous Infusions: . cefTRIAXone (ROCEPHIN)  IV 2 g (09/26/20 1248)     LOS: 1 day        Georgette Shell, MD  09/26/2020, 12:53 PM

## 2020-09-26 NOTE — Progress Notes (Signed)
Pharmacy Antibiotic Note  Paul Lowery is a 69 y.o. male with advanced supraglottic squamous cell cancer admitted on 09/25/2020 from SNF with bleeding from trach site.  Pharmacy has been consulted for cefepime dosing for new infiltrate on chest x-ray.  Plan: Change ceftriaxone to cefepime 2g IV q8h Doxycycline per MD Follow up renal function & cultures  Height: 6\' 3"  (190.5 cm) Weight: 97 kg (213 lb 13.5 oz) IBW/kg (Calculated) : 84.5  Temp (24hrs), Avg:98.6 F (37 C), Min:97.5 F (36.4 C), Max:100.2 F (37.9 C)  Recent Labs  Lab 09/25/20 0809 09/26/20 0210  WBC 9.9 8.8  CREATININE 0.89 0.91    Estimated Creatinine Clearance: 90.3 mL/min (by C-G formula based on SCr of 0.91 mg/dL).    No Known Allergies  Antimicrobials this admission:  12/18 CTX >> 12/19 12/18 Doxy >>  12/19 Cefepime >>  Dose adjustments this admission:   Microbiology results:  12/18 COVID/Flu: neg 12/18 MRSA PCR: neg  Thank you for allowing pharmacy to be a part of this patient's care.  Peggyann Juba, PharmD, BCPS Pharmacy: [phone] 09/26/2020 1:23 PM

## 2020-09-27 LAB — BPAM RBC
Blood Product Expiration Date: 202201202359
Blood Product Expiration Date: 202201202359
Blood Product Expiration Date: 202201202359
ISSUE DATE / TIME: 202112181013
ISSUE DATE / TIME: 202112181416
ISSUE DATE / TIME: 202112190835
Unit Type and Rh: 5100
Unit Type and Rh: 5100
Unit Type and Rh: 5100

## 2020-09-27 LAB — BASIC METABOLIC PANEL
Anion gap: 13 (ref 5–15)
BUN: 35 mg/dL — ABNORMAL HIGH (ref 8–23)
CO2: 26 mmol/L (ref 22–32)
Calcium: 8.7 mg/dL — ABNORMAL LOW (ref 8.9–10.3)
Chloride: 98 mmol/L (ref 98–111)
Creatinine, Ser: 1.06 mg/dL (ref 0.61–1.24)
GFR, Estimated: >60 mL/min (ref >60)
Glucose, Bld: 68 mg/dL — ABNORMAL LOW (ref 70–99)
Potassium: 4 mmol/L (ref 3.5–5.1)
Sodium: 137 mmol/L (ref 135–145)

## 2020-09-27 LAB — CBC
HCT: 25.4 % — ABNORMAL LOW (ref 39.0–52.0)
Hemoglobin: 8.1 g/dL — ABNORMAL LOW (ref 13.0–17.0)
MCH: 26 pg (ref 26.0–34.0)
MCHC: 31.9 g/dL (ref 30.0–36.0)
MCV: 81.4 fL (ref 80.0–100.0)
Platelets: 308 10*3/uL (ref 150–400)
RBC: 3.12 MIL/uL — ABNORMAL LOW (ref 4.22–5.81)
RDW: 17.2 % — ABNORMAL HIGH (ref 11.5–15.5)
WBC: 9.3 10*3/uL (ref 4.0–10.5)
nRBC: 0 % (ref 0.0–0.2)

## 2020-09-27 LAB — TYPE AND SCREEN
ABO/RH(D): O POS
Antibody Screen: NEGATIVE
Unit division: 0
Unit division: 0
Unit division: 0

## 2020-09-27 LAB — GLUCOSE, CAPILLARY
Glucose-Capillary: 121 mg/dL — ABNORMAL HIGH (ref 70–99)
Glucose-Capillary: 130 mg/dL — ABNORMAL HIGH (ref 70–99)
Glucose-Capillary: 98 mg/dL (ref 70–99)

## 2020-09-27 MED ORDER — OSMOLITE 1.2 CAL PO LIQD: 1000.0000 mL | ORAL | Status: DC

## 2020-09-27 MED ORDER — PROSOURCE TF PO LIQD
45.0000 mL | Freq: Three times a day (TID) | ORAL | Status: DC
  Administered 2020-09-27 – 2020-09-28 (×3): 45 mL
  Filled 2020-09-27 (×3): qty 45

## 2020-09-27 MED ORDER — SODIUM CHLORIDE 0.9 % IV SOLN
INTRAVENOUS | Status: DC | PRN
  Administered 2020-09-27: 13:00:00 500 mL via INTRAVENOUS

## 2020-09-27 MED ORDER — FREE WATER
240.0000 mL | Freq: Two times a day (BID) | Status: DC
  Administered 2020-09-27 – 2020-09-28 (×2): 240 mL

## 2020-09-27 MED ORDER — JEVITY 1.5 CAL/FIBER PO LIQD
350.0000 mL | Freq: Four times a day (QID) | ORAL | Status: DC
  Administered 2020-09-27 – 2020-09-28 (×4): 350 mL
  Filled 2020-09-27 (×6): qty 474

## 2020-09-27 NOTE — Progress Notes (Signed)
Subjective: Resting comfortably in bed.  No significant issues overnight.  Objective: Vital signs in last 24 hours: Temp:  [97.5 F (36.4 C)-98.8 F (37.1 C)] 97.9 F (36.6 C) (12/20 0800) Pulse Rate:  [72-133] 92 (12/20 1115) Resp:  [0-28] 17 (12/20 1115) BP: (111-162)/(36-105) 145/68 (12/20 0852) SpO2:  [92 %-100 %] 97 % (12/20 1115) FiO2 (%):  [28 %] 28 % (12/20 0852)  Physical Exam: General appearance: cachectic and no distress Head: Normocephalic, without obvious abnormality, atraumatic Ears: Examination of the ears shows normal auricles and external auditory canals bilaterally.  Nose: Nasal examination shows normal mucosa, septum, turbinates.  Face: Facial examination shows no asymmetry. Palpation of the face elicit no significant tenderness.  Mouth: Oral cavity examination shows no bleeding in pharynx. No significant trismus is noted.  Neck: Trach tube in place. No bleeding from the trach stoma.   Recent Labs    09/26/20 1642 09/27/20 0210  WBC 8.8 9.3  HGB 8.2* 8.1*  HCT 25.0* 25.4*  PLT 226 308   Recent Labs    09/26/20 0210 09/27/20 0210  NA 136 137  K 4.0 4.0  CL 96* 98  CO2 29 26  GLUCOSE 81 68*  BUN 33* 35*  CREATININE 0.91 1.06  CALCIUM 8.6* 8.7*    Medications:  I have reviewed the patient's current medications. Scheduled: . acetaminophen  975 mg Per Tube Q8H  . atorvastatin  80 mg Per Tube Daily  . budesonide  0.5 mg Nebulization Q12H  . carvedilol  25 mg Per Tube BID  . chlorhexidine  15 mL Mouth Rinse BID  . Chlorhexidine Gluconate Cloth  6 each Topical Daily  . doxycycline  100 mg Per Tube Q12H  . furosemide  80 mg Per Tube Daily  . guaiFENesin  10 mL Per Tube Q12H  . ipratropium-albuterol  3 mL Nebulization Q6H  . loratadine  10 mg Per Tube Daily  . mouth rinse  15 mL Mouth Rinse q12n4p  . melatonin  3 mg Per Tube QHS  . oxyCODONE  5 mg Per Tube Q6H  . senna-docusate  1 tablet Per Tube Daily  . sodium chloride flush  3 mL  Intravenous Q12H   Continuous: . ceFEPime (MAXIPIME) IV Stopped (09/27/20 1308)    Assessment/Plan: Likely chronic bleeding from the supraglottic tumor. No bleeding source was noted from the trach stoma or the trachea on his recent endoscopic exam. - ? palliative radiation or embolization. The effectiveness is uncertain. - Consider transfer to his primary oncology team at [facility] if significant bleeding recurs.   LOS: 2 days   Paul Lowery W Rosa Wyly 09/27/2020, 12:22 PM

## 2020-09-27 NOTE — Progress Notes (Addendum)
PROGRESS NOTE    Paul Lowery  TIW:[id number] DOB: [birth year] DOA: 09/25/2020 PCP: Benito Mccreedy, MD   Brief Narrative: HPI per Dr. Neysa Bonito on 09/25/2020  69 year old male with past medical history of advanced supraglottic squamous cell cancer, chronic hypercapnic respiratory failure and COPD with chronic trach with chronic bleeding/oozing from trach site follows at [facility], G-tube, CAD s/p CABG, A. fib off Eliquis for the past year, chronic diastolic heart failure, recent right hip fracture s/p repair on 06/25/2020, chronic anemia who presents to the ED via EMS from [facility] after staff reported bleeding and blood clots from his trach site.  This is a chronic issue with recurrent wheezing and mild bleeding from the site with periodic exacerbations.  He denies any pain or soreness in the area.  He states that he believes he is still taking Lovenox for VTE prophylaxis from his recent hip fracture   Upon presentation to bedside, patient was in active respiratory distress and coughing up bright red blood with blood clots.  O2 sat hovered between mid to high 80s.  Patient was performing suction on his own and coughing.  Bedside nurse arrived at bedside and reported RT was consulted and on their way.  Eventually his hypoxia and bleeding improved but did not resolve and he was stabilized.  Palliative care NP was present and patient states that he is DNR.   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  He was placed on 5 L of oxygen.  Presently he is saturating 95%  on 5 L of oxygen. Chest x-ray showed new infiltrate.  Patient comes from long-term care facility.  We will change his antibiotics to cefepime and doxycycline.  #2 advanced supraglottic squamous cell cancer followed at [facility].  #3 symptomatic anemia secondary to bleeding from the trach site patient received a unit of blood transfusion on 09/26/2020.  Hemoglobin stable at 8.1.  Appreciate ENT input.  Status post flexible fiberoptic laryngoscopy and tracheoscopy.  Diffuse bleeding noted from the supraglottic tumor no bleeding identified from the trach stoma over the trachea.   #4 history of essential hypertension-sure is soft 123/56 he was on lisinopril and carvedilol prior to admission.  Adjust the dose as needed.  Continue to hold lisinopril and decrease the dose of carvedilol.    #5 history of CAD and CABG on statin and beta-blocker  #6 goals of care patient is DNR.  With chronic multiple comorbidities and decreased functional status I agree with DNR.  Prognosis is poor.  #7 FEN patient has a PEG tube in place restart tube feeds today.   Estimated body mass index is 26.73 kg/m as calculated from the following:   Height as of this encounter: 6\' 3"  (1.905 m).   Weight as of this encounter: 97 kg.  DVT prophylaxis: SCD Code Status: DNR Family Communication: Called [facility] without response.  Disposition Plan:  Status is: Inpatient  Dispo: The patient is from: SNF              Anticipated d/c is to: LTAC  Anticipated d/c date is: 2 days              Patient currently is not medically stable to d/c.   Consultants: ENT and palliative care  Procedures: Status post flexible laryngoscopy and tracheoscopy 09/25/2020. Antimicrobials: Cefepime and doxycycline  Subjective:  He is resting in bed no bleeding overnight he is in no acute distress however he is trying to tell me that he does not want to be discharged today he wants to stay another 2 days I tried calling his daughter  without any response. Staff reporting thick secretions Objective: Vitals:   09/27/20 1000 09/27/20 1100 09/27/20 1115 09/27/20 1200  BP: 126/61 (!) 143/103  (!) 123/56  Pulse: 95 96 92 84  Resp: (!) 23 13 17  (!) 26  Temp:    98.5 F (36.9 C)  TempSrc:    Oral  SpO2: 98% 93% 97% 97%  Weight:      Height:        Intake/Output Summary (Last 24 hours) at 09/27/2020 1305 Last data filed at 09/27/2020 1200 Gross per 24 hour  Intake 449.86 ml  Output 1250 ml  Net -800.14 ml   Filed Weights   09/25/20 2100  Weight: 97 kg    Examination:  General exam: Appears in nad Respiratory system: Rhonchi bilaterally to auscultation. Respiratory effort normal.  Trach in place with no evidence of active bleeding presently.   Cardiovascular system: S1 & S2 heard, RRR. No JVD, murmurs, rubs, gallops or clicks. No pedal edema. Gastrointestinal system: Abdomen is nondistended, soft and nontender. No organomegaly or masses felt. Normal bowel sounds heard.  PEG in place. Central nervous system: Alert and oriented. No focal neurological deficits. Extremities: 1+ bilateral pitting edema. Skin: No rashes, lesions or ulcers Psychiatry: Judgement and insight appear normal. Mood & affect appropriate.     Data Reviewed: I have personally reviewed following labs and imaging studies  CBC: Recent Labs  Lab 09/25/20 0809 09/25/20 2121 09/26/20 0210 09/26/20 1533 09/26/20 1642 09/27/20 0210  WBC 9.9  --  8.8  --  8.8 9.3  HGB 6.6* 7.6* 7.0* 8.4* 8.2* 8.1*  HCT 21.2* 23.3* 22.0* 26.6* 25.0* 25.4*  MCV 81.2  --  80.6  --  79.9* 81.4  PLT 306  --  291  --  226 481   Basic Metabolic Panel: Recent Labs  Lab 09/25/20 0809 09/26/20 0210 09/27/20 0210  NA 133* 136 137  K 4.3 4.0 4.0  CL 94* 96* 98  CO2 30 29 26   GLUCOSE 107* 81 68*  BUN 31* 33* 35*  CREATININE 0.89 0.91 1.06  CALCIUM 8.6* 8.6* 8.7*   GFR: Estimated Creatinine Clearance: 77.5 mL/min (by C-G formula based on SCr of 1.06  mg/dL). Liver Function Tests: No results for input(s): AST, ALT, ALKPHOS, BILITOT, PROT, ALBUMIN in the last 168 hours. No results for input(s): LIPASE, AMYLASE in the last 168 hours. No results for input(s): AMMONIA in the last 168 hours. Coagulation Profile: No results for input(s): INR, PROTIME in the last 168 hours. Cardiac Enzymes: No results for input(s): CKTOTAL, CKMB, CKMBINDEX, TROPONINI in the last 168 hours. BNP (last 3 results) No results for input(s): PROBNP in the last 8760 hours. HbA1C: No results for input(s): HGBA1C in the last 72 hours. CBG: No results for input(s): GLUCAP in the last 168 hours. Lipid Profile: No results for input(s): CHOL, HDL, LDLCALC, TRIG, CHOLHDL, LDLDIRECT in the last 72 hours. Thyroid Function Tests: No results for input(s): TSH, T4TOTAL,  FREET4, T3FREE, THYROIDAB in the last 72 hours. Anemia Panel: No results for input(s): VITAMINB12, FOLATE, FERRITIN, TIBC, IRON, RETICCTPCT in the last 72 hours. Sepsis Labs: No results for input(s): PROCALCITON, LATICACIDVEN in the last 168 hours.  Recent Results (from the past 240 hour(s))  Resp Panel by RT-PCR (Flu A&B, Covid) Nasopharyngeal Swab     Status: None   Collection Time: 09/25/20 10:50 AM   Specimen: Nasopharyngeal Swab; Nasopharyngeal(NP) swabs in vial transport medium  Result Value Ref Range Status   SARS Coronavirus 2 by RT PCR NEGATIVE NEGATIVE Final    Comment: (NOTE) SARS-CoV-2 target nucleic acids are NOT DETECTED.  The SARS-CoV-2 RNA is generally detectable in upper respiratory specimens during the acute phase of infection. The lowest concentration of SARS-CoV-2 viral copies this assay can detect is 138 copies/mL. A negative result does not preclude SARS-Cov-2 infection and should not be used as the sole basis for treatment or other patient management decisions. A negative result may occur with  improper specimen collection/handling, submission of specimen other than  nasopharyngeal swab, presence of viral mutation(s) within the areas targeted by this assay, and inadequate number of viral copies(<138 copies/mL). A negative result must be combined with clinical observations, patient history, and epidemiological information. The expected result is Negative.  Fact Sheet for Patients:  EntrepreneurPulse.com.au  Fact Sheet for Healthcare Providers:  IncredibleEmployment.be  This test is no t yet approved or cleared by the [place] FDA and  has been authorized for detection and/or diagnosis of SARS-CoV-2 by FDA under an Emergency Use Authorization (EUA). This EUA will remain  in effect (meaning this test can be used) for the duration of the COVID-19 declaration under Section 564(b)(1) of the Act, 21 U.S.C.section 360bbb-3(b)(1), unless the authorization is terminated  or revoked sooner.       Influenza A by PCR NEGATIVE NEGATIVE Final   Influenza B by PCR NEGATIVE NEGATIVE Final    Comment: (NOTE) The Xpert Xpress SARS-CoV-2/FLU/RSV plus assay is intended as an aid in the diagnosis of influenza from Nasopharyngeal swab specimens and should not be used as a sole basis for treatment. Nasal washings and aspirates are unacceptable for Xpert Xpress SARS-CoV-2/FLU/RSV testing.  Fact Sheet for Patients: EntrepreneurPulse.com.au  Fact Sheet for Healthcare Providers: IncredibleEmployment.be  This test is not yet approved or cleared by the [place] FDA and has been authorized for detection and/or diagnosis of SARS-CoV-2 by FDA under an Emergency Use Authorization (EUA). This EUA will remain in effect (meaning this test can be used) for the duration of the COVID-19 declaration under Section 564(b)(1) of the Act, 21 U.S.C. section 360bbb-3(b)(1), unless the authorization is terminated or revoked.  Performed at [facility], [place] [address], [place], [place] [address]   MRSA PCR Screening     Status: None   Collection Time: 09/25/20  8:19 PM   Specimen: Nasopharyngeal  Result Value Ref Range Status   MRSA by PCR NEGATIVE NEGATIVE Final    Comment:        The GeneXpert MRSA Assay (FDA approved for NASAL specimens only), is one component of a comprehensive MRSA colonization surveillance program. It is not intended to diagnose MRSA infection nor to guide or monitor treatment for MRSA infections. Performed at [facility], [place] [address], [place], [place] [address]          Radiology Studies: No results found.      Scheduled Meds: . acetaminophen  975 mg Per Tube Q8H  .  atorvastatin  80 mg Per Tube Daily  . budesonide  0.5 mg Nebulization Q12H  . carvedilol  25 mg Per Tube BID  . chlorhexidine  15 mL Mouth Rinse BID  . Chlorhexidine Gluconate Cloth  6 each Topical Daily  . doxycycline  100 mg Per Tube Q12H  . furosemide  80 mg Per Tube Daily  . guaiFENesin  10 mL Per Tube Q12H  . ipratropium-albuterol  3 mL Nebulization Q6H  . loratadine  10 mg Per Tube Daily  . mouth rinse  15 mL Mouth Rinse q12n4p  . melatonin  3 mg Per Tube QHS  . oxyCODONE  5 mg Per Tube Q6H  . senna-docusate  1 tablet Per Tube Daily  . sodium chloride flush  3 mL Intravenous Q12H   Continuous Infusions: . sodium chloride 500 mL (09/27/20 1302)  . ceFEPime (MAXIPIME) IV 2 g (09/27/20 1304)     LOS: 2 days        Georgette Shell, MD  09/27/2020, 1:05 PM

## 2020-09-27 NOTE — Progress Notes (Signed)
Daily Progress Note   Patient Name: Paul Lowery       Date: 09/27/2020 DOB: May 20, 1951  Age: 69 y.o. MRN#: [id number] Attending Physician: Georgette Shell, MD Primary Care Physician: Benito Mccreedy, MD Admit Date: 09/25/2020  Reason for Consultation/Follow-up: To discuss complex medical decision making related to patient's goals of care  Subjective: Visited Mr. Zerby at bedside.  Fortunately his bleeding has resolved and he looks much better.  He is very talkative, but is having trouble with thick secretions - frequent coughing.  He is very intent on being cared for at home and not returning to any facility.  He voices several complaints about his previous facility. He tells me he has 4 nurses in his family and he can be cared for at home.   He asked me to speak with his daughter [name]) and his sister Joaquin Courts) about going home.     We reviewed a MOST form together.  He re-confirms DNR.  Under the next section - he wants to return to the hospital - he seems to be between the 1st and 2nd options on the MOST form.  He asks me to discuss it with his daughter.   He tells me he needs a new local oncologist that there are currently no plans for treatment of his tumor.  He asks me to call both his daughter and sister.   I attempted to call [name], but her voice mail box was full and I could not leave a message.  The patient then gave me Edna's number [phone] - I was able to call her and leave a message.   Assessment: Bleeding seems to have stopped.  Patient much improved.  Battling thick secretions.   Patient Profile/HPI:  69 y.o. male  with past medical history of advanced supraglottic cancer with a large friable tumor status post radiation, coronary artery disease status  post coronary artery bypass graft, diastolic heart failure, COPD, atrial fibrillation, right hip fracture in September 2021, diabetes mellitus on oral therapy.  He presented to the [facility], ER from [facility] due to bleeding from his trach.  Current hemoglobin is 6.6.  Review of EPIC notes indicates he is followed at [facility] for his supraglottic cancer.  Physician consult on June 16, 2020 indicated that the patient is not a candidate for  systemic therapy due to frailty.  The physician had a long conversation with his son and daughter as well as the patient.  Patient indicated his priorities were spending time with his family and being comfortable.  He trusts in [place].  The family expressed at that time that they did not have a trusting relationship with hospice.  They felt hospice hastened the death of a family member.    Length of Stay: 2   Vital Signs: BP 108/67   Pulse 77   Temp 98.5 F (36.9 C) (Oral)   Resp 19   Ht 6\' 3"  (1.905 m)   Wt 97 kg   SpO2 94%   BMI 26.73 kg/m  SpO2: SpO2: 94 % O2 Device: O2 Device: Tracheostomy Collar,Room Air O2 Flow Rate: O2 Flow Rate (L/min): 5 L/min       Palliative Assessment/Data: Uncertain of mobility status.  Alert, orientated, receives tube feeds.     Palliative Care Plan    Recommendations/Plan:  MOST form reviewed and left in room for further discussion between patient and daughter [name] is his decision making surrogate).  Patient intent on discharging to home.  Will need quite a bit of support as well as hospital bed, suction, breathing treatments.  TOC will need to confirm with family that they can support his needs.  O/W if he goes to a facility he wants to go to a different one.   Code Status:  DNR  Prognosis:   Unable to determine   Discharge Planning:  To Be Determined  Care plan was discussed with patient  Thank you for allowing the Palliative Medicine Team to assist in the care of this patient.  Total  time spent:  35 min.     Greater than 50%  of this time was spent counseling and coordinating care related to the above assessment and plan.  Florentina Jenny, PA-C Palliative Medicine  Please contact Palliative MedicineTeam phone at [phone] for questions and concerns between 7 am - 7 pm.   Please see AMION for individual provider pager numbers.

## 2020-09-27 NOTE — Progress Notes (Signed)
Initial Nutrition Assessment  DOCUMENTATION CODES:   Non-severe (moderate) malnutrition in context of chronic illness  INTERVENTION:  - will order 350 ml Jevity 1.5 QID with 45 ml Prosource TF TID and 120 ml free water per TF bolus via PEG. - this regimen will provide 2220 kcal, 122 grams protein, and 1544 ml free water.  - also provide 240 ml free water BID outside of TF boluses (additional 480 ml/day).    NUTRITION DIAGNOSIS:   Moderate Malnutrition related to chronic illness,cancer and cancer related treatments (COPD) as evidenced by mild fat depletion,mild muscle depletion,percent weight loss.  GOAL:   Patient will meet greater than or equal to 90% of their needs  MONITOR:   TF tolerance,Labs,Weight trends  REASON FOR ASSESSMENT:   Consult Enteral/tube feeding initiation and management  ASSESSMENT:   69 year old male with medical history of advanced supraglottic squamous cell cancer, chronic hypercapnic respiratory failure and COPD with chronic trach with chronic bleeding/oozing from trach site follows at [facility] G-tube, CAD s/p CABG, A. fib, CHF, R hip fracture s/p repair on 06/25/2020, and anemia. He presented to the ED via EMS from [place] after staff reported bleeding and blood clots from his trach site. He denied any pain or soreness in the area.  Patient has been NPO since admission. He has PEG in place from PTA. Review of chart indicates last documented TF order for outpatient was 250 ml Jevity 1.5 every 4 hours. This regimen would provide 1500 ml of formula, 2250 kcal, 96 grams protein, and 1140 ml free water.  Patient laying in bed with no family/visitors present. Patient is able to mouth words to talk with RD. He denies abdominal pain/pressure or nausea but is feeling very hungry d/t no nutrition since PTA. He does not consume items PO, although he would like to.   At [place], he was receiving bolus TF but he is unable to provide any additional information on this.    Patient's lower body was covered in 4-5 blankets so unable to assess legs during NFPE.   Weight on 09/25/20 was 214 lb and PTA the most recently documented weight was on 06/25/20 when he weighed 233 lb. This indicates 19 lb weight loss (8% body weight) in the past 3 months; significant for time frame.    Labs reviewed; BUN: 35 mg/dl, Ca: 8.7 mg/dl. Medications reviewed; 80 mg lasix per PEG/day, 1 tablet senokot/day.      NUTRITION - FOCUSED PHYSICAL EXAM:  Flowsheet Row Most Recent Value  Orbital Region Mild depletion  Upper Arm Region No depletion  Thoracic and Lumbar Region Unable to assess  Buccal Region Mild depletion  Temple Region No depletion  Clavicle Bone Region Mild depletion  Clavicle and Acromion Bone Region Mild depletion  Scapular Bone Region Mild depletion  Dorsal Hand Mild depletion  Patellar Region Unable to assess  Anterior Thigh Region Unable to assess  Posterior Calf Region Unable to assess  Edema (RD Assessment) Unable to assess  Hair Reviewed  Eyes Reviewed  Mouth Reviewed  Skin Reviewed  Nails Reviewed       Diet Order:   Diet Order            Diet NPO time specified Except for: Sips with Meds  Diet effective now                 EDUCATION NEEDS:   Education needs have been addressed  Skin:  Skin Assessment: Reviewed RN Assessment  Last BM:  PTA/unknown  Height:  Ht Readings from Last 1 Encounters:  09/25/20 6\' 3"  (1.905 m)    Weight:   Wt Readings from Last 1 Encounters:  09/25/20 97 kg     Estimated Nutritional Needs:  Kcal:  2135-2325 kcal Protein:  110-125 grams Fluid:  >/= 2.5 L/day     Jarome Matin, MS, RD, LDN, CNSC Inpatient Clinical Dietitian RD pager # available in AMION  After hours/weekend pager # available in [facility]

## 2020-09-27 NOTE — TOC Initial Note (Signed)
Transition of Care [facility]) - Initial/Assessment Note    Patient Details  Name: Paul Lowery MRN: [id number] Date of Birth: [birth year]  Transition of Care [facility]) CM/SW Contact:    Leeroy Cha, RN Phone Number: 09/27/2020, 8:11 AM  Clinical Narrative:                 68 year old male with past medical history of advanced supraglottic squamous cell cancer, chronic hypercapnic respiratory failure and COPD with chronic trach with chronic bleeding/oozing from trach site follows at [facility], G-tube, CAD s/p CABG, A. fib off Eliquis for the past year, chronic diastolic heart failure, recent right hip fracture s/p repair on 06/25/2020, chronic anemia who presents to the ED via EMS from [facility] after staff reported bleeding and blood clots from his trach site.  This is a chronic issue with recurrent wheezing and mild bleeding from the site with periodic exacerbations.  He denies any pain or soreness in the area.  He states that he believes he is still taking Lovenox for VTE prophylaxis from his recent hip fracture   Upon presentation to bedside, patient was in active respiratory distress and coughing up bright red blood with blood clots.  O2 sat hovered between mid to high 80s.  Patient was performing suction on his own and coughing.  Bedside nurse arrived at bedside and reported RT was consulted and on their way.  Eventually his hypoxia and bleeding improved but did not resolve and he was stabilized.  Palliative care NP was present and patient states that he is DNR.   ED Course: Afebrile, hemodynamically stable, Desaturation to mid to high 80s with bleeding as above which improved.  Tolerated 5 LPM per trach collar. Notable Labs: Sodium 133, K4.3, glucose 107, BUN 31, creatinine 0.89, WBC 9.9, Hb 6.6, platelets 306.  EDP consulted palliative care and ordered 2 units PRBCs which have not been transfused yet. PLAN:  Receiving one unit prbc this am for hgb of 8.1,trach collar at 28%. FRom home has a  daughter in the area Has a pcp Should be able to return to home Following for progression  Expected Discharge Plan: Home/Self Care Barriers to Discharge: No Barriers Identified   Patient Goals and CMS Choice Patient states their goals for this hospitalization and ongoing recovery are:: to go home CMS Medicare.gov Compare Post Acute Care list provided to:: Patient    Expected Discharge Plan and Services Expected Discharge Plan: Home/Self Care   Discharge Planning Services: CM Consult   Living arrangements for the past 2 months: Single Family Home                                      Prior Living Arrangements/Services Living arrangements for the past 2 months: Single Family Home Lives with:: Self Patient language and need for interpreter reviewed:: Yes Do you feel safe going back to the place where you live?: Yes      Need for Family Participation in Patient Care: Yes (Comment) Care giver support system in place?: Yes (comment)   Criminal Activity/Legal Involvement Pertinent to Current Situation/Hospitalization: No - Comment as needed  Activities of Daily Living Home Assistive Devices/Equipment: [place] Rile (specify type) ADL Screening (condition at time of admission) Patient's cognitive ability adequate to safely complete daily activities?: Yes Is the patient deaf or have difficulty hearing?: No Does the patient have difficulty seeing, even when wearing glasses/contacts?: No Does the patient have  difficulty concentrating, remembering, or making decisions?: No Patient able to express need for assistance with ADLs?: Yes Does the patient have difficulty dressing or bathing?: No Independently performs ADLs?: No Communication: Independent with device (comment) (passy muir valve) Is this a change from baseline?: Pre-admission baseline Dressing (OT): Independent Grooming: Independent Feeding: Needs assistance,Independent with device (comment) (g tube) Is this a change from  baseline?: Pre-admission baseline Bathing: Independent Toileting: Independent In/Out Bed: Independent Walks in Home: Independent Does the patient have difficulty walking or climbing stairs?: Yes Weakness of Legs: Both Weakness of Arms/Hands: None  Permission Sought/Granted                  Emotional Assessment Appearance:: Appears stated age Attitude/Demeanor/Rapport: Engaged Affect (typically observed): Calm Orientation: : Oriented to Place,Oriented to Self,Oriented to  Time,Oriented to Situation Alcohol / Substance Use: Not Applicable Psych Involvement: No (comment)  Admission diagnosis:  Hypoxia [R09.02] Head and neck cancer ([place]) [C76.0] Symptomatic anemia [D64.9] Patient Active Problem List   Diagnosis Date Noted  . Symptomatic anemia 09/25/2020  . CAP (community acquired pneumonia) 09/25/2020  . Acute on chronic respiratory failure with hypoxia ([place]) 09/25/2020  . Chronic diastolic CHF (congestive heart failure) ([place]) 06/25/2020  . Status post tracheostomy ([place]) 06/25/2020  . Squamous cell carcinoma of tonsillar pillar ([place]) 06/25/2020  . COPD (chronic obstructive pulmonary disease) ([place]) 06/25/2020  . Essential hypertension 06/25/2020  . Atrial fibrillation, chronic ([place]) 06/25/2020  . Hyperlipidemia 06/25/2020  . Malnutrition of moderate degree 06/25/2020  . Closed comminuted fracture of hip, right, initial encounter ([place]) 06/24/2020   PCP:  Benito Mccreedy, MD Pharmacy:  No Pharmacies Listed    Social Determinants of Health (SDOH) Interventions    Readmission Risk Interventions No flowsheet data found.

## 2020-09-27 NOTE — Progress Notes (Signed)
Chaplain Georgana Romain checked in with Jasim, introducing herself and offering support.  Chaplain will follow-up as needed.    09/27/20 1200  Clinical Encounter Type  Visited With Patient  Visit Type Initial

## 2020-09-28 LAB — GLUCOSE, CAPILLARY
Glucose-Capillary: 92 mg/dL (ref 70–99)
Glucose-Capillary: 170 mg/dL — ABNORMAL HIGH (ref 70–99)

## 2020-09-28 LAB — CBC
HCT: 26.8 % — ABNORMAL LOW (ref 39.0–52.0)
Hemoglobin: 8.4 g/dL — ABNORMAL LOW (ref 13.0–17.0)
MCH: 26 pg (ref 26.0–34.0)
MCHC: 31.3 g/dL (ref 30.0–36.0)
MCV: 83 fL (ref 80.0–100.0)
Platelets: 305 10*3/uL (ref 150–400)
RBC: 3.23 MIL/uL — ABNORMAL LOW (ref 4.22–5.81)
RDW: 17.4 % — ABNORMAL HIGH (ref 11.5–15.5)
WBC: 8.9 10*3/uL (ref 4.0–10.5)
nRBC: 0 % (ref 0.0–0.2)

## 2020-09-28 LAB — BASIC METABOLIC PANEL
Anion gap: 10 (ref 5–15)
BUN: 38 mg/dL — ABNORMAL HIGH (ref 8–23)
CO2: 26 mmol/L (ref 22–32)
Calcium: 8.7 mg/dL — ABNORMAL LOW (ref 8.9–10.3)
Chloride: 100 mmol/L (ref 98–111)
Creatinine, Ser: 1.2 mg/dL (ref 0.61–1.24)
GFR, Estimated: >60 mL/min (ref >60)
Glucose, Bld: 100 mg/dL — ABNORMAL HIGH (ref 70–99)
Potassium: 3.9 mmol/L (ref 3.5–5.1)
Sodium: 136 mmol/L (ref 135–145)

## 2020-09-28 MED ORDER — FUROSEMIDE 80 MG PO TABS
40.0000 mg | ORAL_TABLET | Freq: Every day | ORAL | Status: AC
Start: 2020-09-28 — End: ?

## 2020-09-28 MED ORDER — IPRATROPIUM-ALBUTEROL 0.5-2.5 (3) MG/3ML IN SOLN
3.0000 mL | Freq: Three times a day (TID) | RESPIRATORY_TRACT | Status: DC
  Administered 2020-09-28 (×2): 3 mL via RESPIRATORY_TRACT
  Filled 2020-09-28 (×2): qty 3

## 2020-09-28 MED ORDER — DOXYCYCLINE HYCLATE 100 MG PO TABS: 100.0000 mg | ORAL_TABLET | Freq: Two times a day (BID) | ORAL | 0 refills | Status: DC

## 2020-09-28 MED ORDER — AMOXICILLIN-POT CLAVULANATE 875-125 MG PO TABS: 1.0000 | ORAL_TABLET | Freq: Two times a day (BID) | ORAL | 0 refills | Status: AC

## 2020-09-28 NOTE — TOC Progression Note (Addendum)
Transition of Care [facility]) - Progression Note    Patient Details  Name: Paul Lowery MRN: [id number] Date of Birth: [birth year]  Transition of Care [facility]) CM/SW Contact  Leeroy Cha, RN Phone Number: 09/28/2020, 11:55 AM  Clinical Narrative:    Have daughter with no answer voice mail is full. Sherryl Barters is disconnected at [phone] Parke Simmers at [phone] left to please call back. No return call  [facility]. Patient informed that he will have [facility] to [facility] due to his needs and no other safe discharge. Samantha the icu nurse is also aware. Med necessity form filled out. Dr. Zigmund Daniel made aware. Expected Discharge Plan: Home/Self Care Barriers to Discharge: No Barriers Identified  Expected Discharge Plan and Services Expected Discharge Plan: Home/Self Care   Discharge Planning Services: CM Consult   Living arrangements for the past 2 months: Single Family Home                                       Social Determinants of Health (SDOH) Interventions    Readmission Risk Interventions Readmission Risk Prevention Plan 09/27/2020  Transportation Screening Complete  PCP or Specialist Appt within 3-5 Days Complete  HRI or [place] Complete  Social Work Consult for [place] Planning/Counseling Complete  Palliative Care Screening Not Applicable  Medication Review [employer]) Complete  Some recent data might be hidden

## 2020-09-28 NOTE — Progress Notes (Signed)
Report called and given to [place], Therapist, sports.  Patient discharged with all paperwork and instructions given to [facility].

## 2020-09-28 NOTE — Discharge Summary (Signed)
Physician Discharge Summary  Paul Lowery JKK:[id number] DOB: 1951/02/14 DOA: 09/25/2020  PCP: Benito Mccreedy, MD  Admit date: 09/25/2020 Discharge date: 09/28/2020  [facility] Recommendations for Outpatient Follow-up:  1. Follow up with PCP in 1-2 weeks 2. Please obtain BMP/CBC in one week  Home Health:none Equipment/Devices none Discharge Condition stable CODE STATUS dnr Diet recommendation: tube feeds Brief/Interim Summary:69 year old male with past medical history of advanced supraglottic squamous cell cancer, chronic hypercapnic respiratory failure and COPD with chronic trach with chronic bleeding/oozing from trach site follows at [facility], G-tube, CAD s/p CABG, A. fib off Eliquis for the past year, chronic diastolic heart failure, recent right hip fracture s/p repair on 06/25/2020, chronic anemia who presents to the ED via EMS from [facility] after staff reported bleeding and blood clots from his trach site. This is a chronic issue with recurrent wheezing and mild bleeding from the site with periodic exacerbations. He denies any pain or soreness in the area. He states that he believes he is still taking Lovenox for VTE prophylaxis from his recent hip fracture   Upon presentation to bedside, patient was in active respiratory distress and coughing up bright red blood with blood clots. O2 sat hovered between mid to high 80s. Patient was performing suction on his own and coughing. Bedside nurse arrived at bedside and reported RT was consulted and on their way. Eventually his hypoxia and bleeding improved but did not resolve and he was stabilized. Palliative care NP was present and patient states that he is DNR.  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  He was placed on 5 L of oxygen.  Presently he is saturating 95% on 5 L of oxygen. Chest x-ray showed new infiltrate.  Patient was treated with doxycycline and cefepime.  He will be discharged on Augmentin for 4 more days.  MRSA PCR was negative.   #2 advanced supraglottic squamous cell cancer followed at [facility].  #3 symptomatic anemia secondary to bleeding from the trach site patient received a unit of blood transfusion on 09/26/2020.  Hemoglobin stable at 8.1.  He was seen by ENT.  Status post flexible fiberoptic laryngoscopy and tracheoscopy.  Diffuse bleeding noted from the supraglottic tumor no bleeding identified from the trach stoma over the trachea.  He did not have any further bleed last 48 hours.  #4 history of essential hypertension-blood pressure is soft 123/56 he was on lisinopril and carvedilol prior to admission.     #5 history of CAD and CABG on statin and beta-blocker  #6 goals of care patient is DNR.  With chronic multiple comorbidities and decreased functional status I agree with DNR.  Prognosis is poor.  #7 FEN patient has a PEG tube in place continue tube feeds   Nutrition Problem: Moderate Malnutrition Etiology: chronic illness,cancer and cancer related treatments (COPD)    Signs/Symptoms: mild fat depletion,mild muscle depletion,percent weight loss Percent weight loss: 8 %     Interventions: Tube feeding,Prostat  Estimated body mass index is 26.73 kg/m as calculated from the following:   Height as of this  encounter: 6\' 3"  (1.905 m).   Weight as of this encounter: 97 kg.  Discharge Instructions  Discharge Instructions    Diet - low sodium heart healthy   Complete by: As directed    Increase activity  slowly   Complete by: As directed      Allergies as of 09/28/2020   No Known Allergies     Medication List    STOP taking these medications   enoxaparin 40 MG/0.4ML injection Commonly known as: LOVENOX   lisinopril 5 MG tablet Commonly known as: ZESTRIL     TAKE these medications   acetaminophen 325 MG tablet Commonly known as: TYLENOL Place 975 mg into feeding tube every 8 (eight) hours.   amoxicillin-clavulanate 875-125 MG tablet Commonly known as: Augmentin Take 1 tablet by mouth 2 (two) times daily for 3 days.   atorvastatin 80 MG tablet Commonly known as: LIPITOR Place 80 mg into feeding tube daily.   budesonide 0.5 MG/2ML nebulizer solution Commonly known as: PULMICORT Take 0.5 mg by nebulization every 12 (twelve) hours.   carvedilol 25 MG tablet Commonly known as: COREG Place 25 mg into feeding tube every 12 (twelve) hours.   chlorhexidine 0.12 % solution Commonly known as: PERIDEX Use as directed 5 mLs in the mouth or throat 2 (two) times daily.   feeding supplement (JEVITY 1.5 CAL) Liqd Place 250 mLs into feeding tube every 4 (four) hours.   fluticasone 50 MCG/ACT nasal spray Commonly known as: FLONASE Place 1 spray into both nostrils every 12 (twelve) hours as needed for allergies or rhinitis.   free water Soln Place 175 mLs into feeding tube every 4 (four) hours.   furosemide 80 MG tablet Commonly known as: LASIX Place 0.5 tablets (40 mg total) into feeding tube daily. What changed: how much to take   guaiFENesin 100 MG/5ML Soln Commonly known as: ROBITUSSIN Place 10 mLs into feeding tube every 12 (twelve) hours.   HYDROmorphone 2 MG tablet Commonly known as: DILAUDID Place 2 mg into feeding tube every 8 (eight) hours as needed for severe pain.   ipratropium-albuterol 0.5-2.5 (3) MG/3ML Soln Commonly known as: DUONEB Take 3 mLs by nebulization every 6 (six) hours.   loratadine 10 MG tablet Commonly known as: CLARITIN Place 10 mg into  feeding tube daily.   LORazepam 1 MG tablet Commonly known as: ATIVAN Place 1 mg into feeding tube every 6 (six) hours as needed for anxiety.   melatonin 3 MG Tabs tablet Place 3 mg into feeding tube at bedtime.   oxyCODONE 5 MG immediate release tablet Commonly known as: Oxy IR/ROXICODONE Take 1 pills every 6 hrs as needed for pain What changed:   how much to take  how to take this  when to take this  additional instructions   polyethylene glycol 17 g packet Commonly known as: MIRALAX / GLYCOLAX Take 17 g by mouth daily as needed for mild constipation. What changed: how to take this   S2 (Racepinephrine) 2.25 % Nebu nebulizer solution Generic drug: Racepinephrine HCl Take 0.5 mLs by nebulization every 6 (six) hours as needed (shortness of breath, wheezing). Give with 75mL iced saline   senna-docusate 8.6-50 MG tablet Commonly known as: Senokot-S Place 1 tablet into feeding tube daily.   sodium chloride 0.65 % Soln nasal spray Commonly known as: OCEAN Place 1 spray into both nostrils daily as needed for congestion.   Vitamin D3 50 MCG (2000 UT) Chew Place 2,000 Units into feeding tube at bedtime.  Follow-up Information    Benito Mccreedy, MD Follow up.   Specialty: Internal Medicine Contact information: [id number] [place] [address] [phone]              No Known Allergies  Consultations: ent  Procedures/Studies: DG CHEST PORT 1 VIEW  Result Date: 09/25/2020 CLINICAL DATA:  Bloody sputum.  Tracheostomy. EXAM: PORTABLE CHEST 1 VIEW COMPARISON:  07/21/2020 FINDINGS: Tracheostomy tube is seen in expected position. Prior CABG again noted. Cardiomegaly and pulmonary vascular congestion remains stable. New opacity is seen in the medial left lower lobe, suspicious for pneumonia. Right lung is clear. No evidence of pleural effusion. IMPRESSION: New opacity in medial left lower lobe, suspicious for pneumonia. Stable  cardiomegaly and pulmonary vascular congestion. Electronically Signed   By: Marlaine Hind M.D.   On: 09/25/2020 11:43    (Echo, Carotid, EGD, Colonoscopy, ERCP)    Subjective: Patient resting in bed he is awake alert talkative no events overnight  Discharge Exam: Vitals:   09/28/20 1100 09/28/20 1200  BP: (!) 133/49 115/81  Pulse: (!) 110 69  Resp:  (!) 23  Temp:  97.6 F (36.4 C)  SpO2: 94% 94%   Vitals:   09/28/20 0900 09/28/20 1000 09/28/20 1100 09/28/20 1200  BP: (!) 149/62 (!) 134/54 (!) 133/49 115/81  Pulse: 66 89 (!) 110 69  Resp: 20   (!) 23  Temp:    97.6 F (36.4 C)  TempSrc:    Oral  SpO2: 96% 92% 94% 94%  Weight:      Height:        General: Pt is alert, awake, not in acute distress Cardiovascular: RRR, S1/S2 +, no rubs, no gallops Respiratory: Few rhonchi bilaterally, no wheezing, no rhonchi Abdominal: Soft, NT, ND, bowel sounds + Extremities: no edema, no cyanosis    The results of significant diagnostics from this hospitalization (including imaging, microbiology, ancillary and laboratory) are listed below for reference.     Microbiology: Recent Results (from the past 240 hour(s))  Resp Panel by RT-PCR (Flu A&B, Covid) Nasopharyngeal Swab     Status: None   Collection Time: 09/25/20 10:50 AM   Specimen: Nasopharyngeal Swab; Nasopharyngeal(NP) swabs in vial transport medium  Result Value Ref Range Status   SARS Coronavirus 2 by RT PCR NEGATIVE NEGATIVE Final    Comment: (NOTE) SARS-CoV-2 target nucleic acids are NOT DETECTED.  The SARS-CoV-2 RNA is generally detectable in upper respiratory specimens during the acute phase of infection. The lowest concentration of SARS-CoV-2 viral copies this assay can detect is 138 copies/mL. A negative result does not preclude SARS-Cov-2 infection and should not be used as the sole basis for treatment or other patient management decisions. A negative result may occur with  improper specimen collection/handling,  submission of specimen other than nasopharyngeal swab, presence of viral mutation(s) within the areas targeted by this assay, and inadequate number of viral copies(<138 copies/mL). A negative result must be combined with clinical observations, patient history, and epidemiological information. The expected result is Negative.  Fact Sheet for Patients:  EntrepreneurPulse.com.au  Fact Sheet for Healthcare Providers:  IncredibleEmployment.be  This test is no t yet approved or cleared by the [place] FDA and  has been authorized for detection and/or diagnosis of SARS-CoV-2 by FDA under an Emergency Use Authorization (EUA). This EUA will remain  in effect (meaning this test can be used) for the duration of the COVID-19 declaration under Section 564(b)(1) of the Act, 21 U.S.C.section 360bbb-3(b)(1), unless  the authorization is terminated  or revoked sooner.       Influenza A by PCR NEGATIVE NEGATIVE Final   Influenza B by PCR NEGATIVE NEGATIVE Final    Comment: (NOTE) The Xpert Xpress SARS-CoV-2/FLU/RSV plus assay is intended as an aid in the diagnosis of influenza from Nasopharyngeal swab specimens and should not be used as a sole basis for treatment. Nasal washings and aspirates are unacceptable for Xpert Xpress SARS-CoV-2/FLU/RSV testing.  Fact Sheet for Patients: EntrepreneurPulse.com.au  Fact Sheet for Healthcare Providers: IncredibleEmployment.be  This test is not yet approved or cleared by the [place] FDA and has been authorized for detection and/or diagnosis of SARS-CoV-2 by FDA under an Emergency Use Authorization (EUA). This EUA will remain in effect (meaning this test can be used) for the duration of the COVID-19 declaration under Section 564(b)(1) of the Act, 21 U.S.C. section 360bbb-3(b)(1), unless the authorization is terminated or revoked.  Performed at [facility], [place] [address] [place], [place] [address]   MRSA PCR Screening     Status: None   Collection Time: 09/25/20  8:19 PM   Specimen: Nasopharyngeal  Result Value Ref Range Status   MRSA by PCR NEGATIVE NEGATIVE Final    Comment:        The GeneXpert MRSA Assay (FDA approved for NASAL specimens only), is one component of a comprehensive MRSA colonization surveillance program. It is not intended to diagnose MRSA infection nor to guide or monitor treatment for MRSA infections. Performed at [facility], [place] [address], [place], [place] [address]      Labs: BNP (last 3 results) Recent Labs    06/24/20 1732  BNP [id number]   Basic Metabolic Panel: Recent Labs  Lab 09/25/20 0809 09/26/20 0210 09/27/20 0210 09/28/20 0223  NA 133* 136 137 136  K 4.3 4.0 4.0 3.9  CL 94* 96* 98 100  CO2 30 29 26 26   GLUCOSE 107* 81 68* 100*  BUN 31* 33* 35* 38*  CREATININE 0.89 0.91 1.06 1.20  CALCIUM 8.6* 8.6* 8.7* 8.7*   Liver Function Tests: No results for input(s): AST, ALT, ALKPHOS, BILITOT, PROT, ALBUMIN in the last 168 hours. No results for input(s): LIPASE, AMYLASE in the last 168 hours. No results for input(s): AMMONIA in the last 168 hours. CBC: Recent Labs  Lab 09/25/20 0809 09/25/20 2121 09/26/20 0210 09/26/20 1533 09/26/20 1642 09/27/20 0210 09/28/20 0223  WBC 9.9  --  8.8  --  8.8 9.3 8.9  HGB 6.6*   < > 7.0* 8.4* 8.2* 8.1* 8.4*  HCT 21.2*   < > 22.0* 26.6* 25.0* 25.4* 26.8*  MCV 81.2  --  80.6  --  79.9* 81.4 83.0  PLT 306  --  291  --  226 308 305   < > = values in this interval not displayed.   Cardiac Enzymes: No results for input(s): CKTOTAL, CKMB, CKMBINDEX, TROPONINI in the last 168 hours. BNP: Invalid input(s): POCBNP CBG: Recent Labs  Lab 09/27/20 1749 09/27/20 1937 09/27/20 2335 09/28/20 0717 09/28/20 1139  GLUCAP 121* 98 130* 92 170*   D-Dimer No results for input(s): DDIMER in the last 72 hours. Hgb A1c No  results for input(s): HGBA1C in the last 72 hours. Lipid Profile No results for input(s): CHOL, HDL, LDLCALC, TRIG, CHOLHDL, LDLDIRECT in the last 72 hours. Thyroid function studies No results for input(s): TSH, T4TOTAL, T3FREE, THYROIDAB in the last 72 hours.  Invalid input(s): FREET3 Anemia work up No  results for input(s): VITAMINB12, FOLATE, FERRITIN, TIBC, IRON, RETICCTPCT in the last 72 hours. Urinalysis No results found for: COLORURINE, APPEARANCEUR, [place], [place], GLUCOSEU, [place], [place], [place], PROTEINUR, UROBILINOGEN, NITRITE, LEUKOCYTESUR Sepsis Labs Invalid input(s): PROCALCITONIN,  WBC,  LACTICIDVEN Microbiology Recent Results (from the past 240 hour(s))  Resp Panel by RT-PCR (Flu A&B, Covid) Nasopharyngeal Swab     Status: None   Collection Time: 09/25/20 10:50 AM   Specimen: Nasopharyngeal Swab; Nasopharyngeal(NP) swabs in vial transport medium  Result Value Ref Range Status   SARS Coronavirus 2 by RT PCR NEGATIVE NEGATIVE Final    Comment: (NOTE) SARS-CoV-2 target nucleic acids are NOT DETECTED.  The SARS-CoV-2 RNA is generally detectable in upper respiratory specimens during the acute phase of infection. The lowest concentration of SARS-CoV-2 viral copies this assay can detect is 138 copies/mL. A negative result does not preclude SARS-Cov-2 infection and should not be used as the sole basis for treatment or other patient management decisions. A negative result may occur with  improper specimen collection/handling, submission of specimen other than nasopharyngeal swab, presence of viral mutation(s) within the areas targeted by this assay, and inadequate number of viral copies(<138 copies/mL). A negative result must be combined with clinical observations, patient history, and epidemiological information. The expected result is Negative.  Fact Sheet for Patients:  EntrepreneurPulse.com.au  Fact Sheet for Healthcare Providers:   IncredibleEmployment.be  This test is no t yet approved or cleared by the [place] FDA and  has been authorized for detection and/or diagnosis of SARS-CoV-2 by FDA under an Emergency Use Authorization (EUA). This EUA will remain  in effect (meaning this test can be used) for the duration of the COVID-19 declaration under Section 564(b)(1) of the Act, 21 U.S.C.section 360bbb-3(b)(1), unless the authorization is terminated  or revoked sooner.       Influenza A by PCR NEGATIVE NEGATIVE Final   Influenza B by PCR NEGATIVE NEGATIVE Final    Comment: (NOTE) The Xpert Xpress SARS-CoV-2/FLU/RSV plus assay is intended as an aid in the diagnosis of influenza from Nasopharyngeal swab specimens and should not be used as a sole basis for treatment. Nasal washings and aspirates are unacceptable for Xpert Xpress SARS-CoV-2/FLU/RSV testing.  Fact Sheet for Patients: EntrepreneurPulse.com.au  Fact Sheet for Healthcare Providers: IncredibleEmployment.be  This test is not yet approved or cleared by the [place] FDA and has been authorized for detection and/or diagnosis of SARS-CoV-2 by FDA under an Emergency Use Authorization (EUA). This EUA will remain in effect (meaning this test can be used) for the duration of the COVID-19 declaration under Section 564(b)(1) of the Act, 21 U.S.C. section 360bbb-3(b)(1), unless the authorization is terminated or revoked.  Performed at [facility], [place] [address] [place], [place] [address]   MRSA PCR Screening     Status: None   Collection Time: 09/25/20  8:19 PM   Specimen: Nasopharyngeal  Result Value Ref Range Status   MRSA by PCR NEGATIVE NEGATIVE Final    Comment:        The GeneXpert MRSA Assay (FDA approved for NASAL specimens only), is one component of a comprehensive MRSA colonization surveillance program. It is not intended to diagnose MRSA infection nor  to guide or monitor treatment for MRSA infections. Performed at [facility], [place] [address], [place], [place] [address]      Time coordinating discharge: 39 minutes  SIGNED:   Georgette Shell, MD  [facility] 09/28/2020, 12:28 PM

## 2020-10-14 ENCOUNTER — Encounter (HOSPITAL_COMMUNITY): Payer: Self-pay

## 2020-10-14 ENCOUNTER — Inpatient Hospital Stay (HOSPITAL_COMMUNITY)
Admission: EM | Admit: 2020-10-14 | Discharge: 2020-11-09 | DRG: 146 | Disposition: E | Payer: Medicare Other | Source: Skilled Nursing Facility | Attending: Internal Medicine | Admitting: Internal Medicine

## 2020-10-14 ENCOUNTER — Other Ambulatory Visit: Payer: Self-pay

## 2020-10-14 ENCOUNTER — Emergency Department (HOSPITAL_COMMUNITY): Payer: Medicare Other

## 2020-10-14 DIAGNOSIS — Z951 Presence of aortocoronary bypass graft: Secondary | ICD-10-CM | POA: Diagnosis not present

## 2020-10-14 DIAGNOSIS — G9341 Metabolic encephalopathy: Secondary | ICD-10-CM | POA: Diagnosis present

## 2020-10-14 DIAGNOSIS — J1282 Pneumonia due to coronavirus disease 2019: Secondary | ICD-10-CM | POA: Diagnosis present

## 2020-10-14 DIAGNOSIS — Z8521 Personal history of malignant neoplasm of larynx: Secondary | ICD-10-CM | POA: Diagnosis not present

## 2020-10-14 DIAGNOSIS — E785 Hyperlipidemia, unspecified: Secondary | ICD-10-CM | POA: Diagnosis present

## 2020-10-14 DIAGNOSIS — Z515 Encounter for palliative care: Secondary | ICD-10-CM

## 2020-10-14 DIAGNOSIS — J9621 Acute and chronic respiratory failure with hypoxia: Secondary | ICD-10-CM | POA: Diagnosis present

## 2020-10-14 DIAGNOSIS — R7989 Other specified abnormal findings of blood chemistry: Secondary | ICD-10-CM

## 2020-10-14 DIAGNOSIS — C140 Malignant neoplasm of pharynx, unspecified: Secondary | ICD-10-CM | POA: Diagnosis present

## 2020-10-14 DIAGNOSIS — I5032 Chronic diastolic (congestive) heart failure: Secondary | ICD-10-CM | POA: Diagnosis present

## 2020-10-14 DIAGNOSIS — I2510 Atherosclerotic heart disease of native coronary artery without angina pectoris: Secondary | ICD-10-CM | POA: Diagnosis present

## 2020-10-14 DIAGNOSIS — J9622 Acute and chronic respiratory failure with hypercapnia: Secondary | ICD-10-CM | POA: Diagnosis present

## 2020-10-14 DIAGNOSIS — C787 Secondary malignant neoplasm of liver and intrahepatic bile duct: Secondary | ICD-10-CM | POA: Diagnosis present

## 2020-10-14 DIAGNOSIS — U071 COVID-19: Secondary | ICD-10-CM | POA: Diagnosis present

## 2020-10-14 DIAGNOSIS — J9501 Hemorrhage from tracheostomy stoma: Secondary | ICD-10-CM | POA: Diagnosis present

## 2020-10-14 DIAGNOSIS — Z7951 Long term (current) use of inhaled steroids: Secondary | ICD-10-CM

## 2020-10-14 DIAGNOSIS — I959 Hypotension, unspecified: Secondary | ICD-10-CM | POA: Diagnosis present

## 2020-10-14 DIAGNOSIS — Z7189 Other specified counseling: Secondary | ICD-10-CM | POA: Diagnosis not present

## 2020-10-14 DIAGNOSIS — N179 Acute kidney failure, unspecified: Secondary | ICD-10-CM | POA: Diagnosis present

## 2020-10-14 DIAGNOSIS — I110 Hypertensive heart disease with heart failure: Secondary | ICD-10-CM | POA: Diagnosis present

## 2020-10-14 DIAGNOSIS — R0602 Shortness of breath: Secondary | ICD-10-CM

## 2020-10-14 DIAGNOSIS — I482 Chronic atrial fibrillation, unspecified: Secondary | ICD-10-CM | POA: Diagnosis present

## 2020-10-14 DIAGNOSIS — E871 Hypo-osmolality and hyponatremia: Secondary | ICD-10-CM | POA: Diagnosis present

## 2020-10-14 DIAGNOSIS — Z66 Do not resuscitate: Secondary | ICD-10-CM | POA: Diagnosis present

## 2020-10-14 DIAGNOSIS — Z79899 Other long term (current) drug therapy: Secondary | ICD-10-CM | POA: Diagnosis not present

## 2020-10-14 DIAGNOSIS — J440 Chronic obstructive pulmonary disease with acute lower respiratory infection: Secondary | ICD-10-CM | POA: Diagnosis present

## 2020-10-14 DIAGNOSIS — D500 Iron deficiency anemia secondary to blood loss (chronic): Secondary | ICD-10-CM | POA: Diagnosis present

## 2020-10-14 DIAGNOSIS — Z7401 Bed confinement status: Secondary | ICD-10-CM

## 2020-10-14 DIAGNOSIS — E860 Dehydration: Secondary | ICD-10-CM | POA: Diagnosis present

## 2020-10-14 DIAGNOSIS — A419 Sepsis, unspecified organism: Secondary | ICD-10-CM

## 2020-10-14 LAB — I-STAT CHEM 8, ED
BUN: 50 mg/dL — ABNORMAL HIGH (ref 8–23)
Calcium, Ion: 1.19 mmol/L (ref 1.15–1.40)
Chloride: 94 mmol/L — ABNORMAL LOW (ref 98–111)
Creatinine, Ser: 2.8 mg/dL — ABNORMAL HIGH (ref 0.61–1.24)
Glucose, Bld: 95 mg/dL (ref 70–99)
HCT: 25 % — ABNORMAL LOW (ref 39.0–52.0)
Hemoglobin: 8.5 g/dL — ABNORMAL LOW (ref 13.0–17.0)
Potassium: 5 mmol/L (ref 3.5–5.1)
Sodium: 131 mmol/L — ABNORMAL LOW (ref 135–145)
TCO2: 26 mmol/L (ref 22–32)

## 2020-10-14 LAB — CBC WITH DIFFERENTIAL/PLATELET
Abs Immature Granulocytes: 0.06 10*3/uL (ref 0.00–0.07)
Basophils Absolute: 0 10*3/uL (ref 0.0–0.1)
Basophils Relative: 0 %
Eosinophils Absolute: 0 10*3/uL (ref 0.0–0.5)
Eosinophils Relative: 0 %
HCT: 23.9 % — ABNORMAL LOW (ref 39.0–52.0)
Hemoglobin: 7.4 g/dL — ABNORMAL LOW (ref 13.0–17.0)
Immature Granulocytes: 1 %
Lymphocytes Relative: 8 %
Lymphs Abs: 0.6 10*3/uL — ABNORMAL LOW (ref 0.7–4.0)
MCH: 25.7 pg — ABNORMAL LOW (ref 26.0–34.0)
MCHC: 31 g/dL (ref 30.0–36.0)
MCV: 83 fL (ref 80.0–100.0)
Monocytes Absolute: 1.3 10*3/uL — ABNORMAL HIGH (ref 0.1–1.0)
Monocytes Relative: 17 %
Neutro Abs: 5.6 10*3/uL (ref 1.7–7.7)
Neutrophils Relative %: 74 %
Platelets: 269 10*3/uL (ref 150–400)
RBC: 2.88 MIL/uL — ABNORMAL LOW (ref 4.22–5.81)
RDW: 19.5 % — ABNORMAL HIGH (ref 11.5–15.5)
WBC: 7.5 10*3/uL (ref 4.0–10.5)
nRBC: 0 % (ref 0.0–0.2)

## 2020-10-14 LAB — COMPREHENSIVE METABOLIC PANEL
ALT: 21 U/L (ref 0–44)
AST: 92 U/L — ABNORMAL HIGH (ref 15–41)
Albumin: 2 g/dL — ABNORMAL LOW (ref 3.5–5.0)
Alkaline Phosphatase: 364 U/L — ABNORMAL HIGH (ref 38–126)
Anion gap: 9 (ref 5–15)
BUN: 53 mg/dL — ABNORMAL HIGH (ref 8–23)
CO2: 24 mmol/L (ref 22–32)
Calcium: 8.6 mg/dL — ABNORMAL LOW (ref 8.9–10.3)
Chloride: 95 mmol/L — ABNORMAL LOW (ref 98–111)
Creatinine, Ser: 2.75 mg/dL — ABNORMAL HIGH (ref 0.61–1.24)
GFR, Estimated: 24 mL/min — ABNORMAL LOW (ref >60)
Glucose, Bld: 95 mg/dL (ref 70–99)
Potassium: 4.9 mmol/L (ref 3.5–5.1)
Sodium: 128 mmol/L — ABNORMAL LOW (ref 135–145)
Total Bilirubin: 1.8 mg/dL — ABNORMAL HIGH (ref 0.3–1.2)
Total Protein: 7.7 g/dL (ref 6.5–8.1)

## 2020-10-14 LAB — I-STAT VENOUS BLOOD GAS, ED
Acid-Base Excess: 0 mmol/L (ref 0.0–2.0)
Bicarbonate: 25.7 mmol/L (ref 20.0–28.0)
Calcium, Ion: 1.19 mmol/L (ref 1.15–1.40)
HCT: 26 % — ABNORMAL LOW (ref 39.0–52.0)
Hemoglobin: 8.8 g/dL — ABNORMAL LOW (ref 13.0–17.0)
O2 Saturation: 84 %
Potassium: 5 mmol/L (ref 3.5–5.1)
Sodium: 131 mmol/L — ABNORMAL LOW (ref 135–145)
TCO2: 27 mmol/L (ref 22–32)
pCO2, Ven: 45.3 mmHg (ref 44.0–60.0)
pH, Ven: 7.362 (ref 7.250–7.430)
pO2, Ven: 51 mmHg — ABNORMAL HIGH (ref 32.0–45.0)

## 2020-10-14 LAB — LACTIC ACID, PLASMA: Lactic Acid, Venous: 2 mmol/L (ref 0.5–1.9)

## 2020-10-14 LAB — RESP PANEL BY RT-PCR (FLU A&B, COVID) ARPGX2
Influenza A by PCR: NEGATIVE
Influenza B by PCR: NEGATIVE
SARS Coronavirus 2 by RT PCR: POSITIVE — AB

## 2020-10-14 LAB — PROTIME-INR
INR: 1.3 — ABNORMAL HIGH (ref 0.8–1.2)
Prothrombin Time: 16.1 seconds — ABNORMAL HIGH (ref 11.4–15.2)

## 2020-10-14 LAB — BRAIN NATRIURETIC PEPTIDE: B Natriuretic Peptide: 151.5 pg/mL — ABNORMAL HIGH (ref 0.0–100.0)

## 2020-10-14 LAB — APTT: aPTT: 57 seconds — ABNORMAL HIGH (ref 24–36)

## 2020-10-14 MED ORDER — SODIUM CHLORIDE 0.9 % IV SOLN
200.0000 mg | Freq: Once | INTRAVENOUS | Status: AC
  Administered 2020-10-15: 200 mg via INTRAVENOUS
  Filled 2020-10-14: qty 40

## 2020-10-14 MED ORDER — SODIUM CHLORIDE 0.9 % IV SOLN
1.0000 g | Freq: Once | INTRAVENOUS | Status: DC
  Filled 2020-10-14: qty 1

## 2020-10-14 MED ORDER — VANCOMYCIN VARIABLE DOSE PER UNSTABLE RENAL FUNCTION (PHARMACIST DOSING): Status: DC

## 2020-10-14 MED ORDER — VANCOMYCIN HCL 1750 MG/350ML IV SOLN
1750.0000 mg | Freq: Once | INTRAVENOUS | Status: AC
  Administered 2020-10-14: 1750 mg via INTRAVENOUS
  Filled 2020-10-14: qty 350

## 2020-10-14 MED ORDER — METHYLPREDNISOLONE SODIUM SUCC 125 MG IJ SOLR: 50.0000 mg | Freq: Once | INTRAMUSCULAR | Status: DC

## 2020-10-14 MED ORDER — SODIUM CHLORIDE 0.9 % IV SOLN
2.0000 g | Freq: Once | INTRAVENOUS | Status: AC
  Administered 2020-10-14: 2 g via INTRAVENOUS
  Filled 2020-10-14: qty 2

## 2020-10-14 MED ORDER — SODIUM CHLORIDE 0.9 % IV SOLN: 100.0000 mg | Freq: Every day | INTRAVENOUS | Status: DC

## 2020-10-14 MED ORDER — SODIUM CHLORIDE 0.9 % IV BOLUS: 500.0000 mL | Freq: Once | INTRAVENOUS | Status: DC

## 2020-10-14 NOTE — Progress Notes (Addendum)
Pharmacy Antibiotic Note  Paul Lowery is a 70 y.o. male admitted on 10/11/2020 with pneumonia.  Pharmacy has been consulted for vancomycin dosing.  Estimated CrCl ~28-29 ml/min, Scr elevated at 2.8 (baseline ~0.9-1.2), most recent weight 81 kg from 10/08/20.   Plan: Vancomycin 1750 mg IV load now Due to poor renal function, pharmacy will order intermittent vancomycin doses as necessary. Monitor renal function, culture results, clinical improvement, de-escalation as appropriate     Temp (24hrs), Avg:98.3 F (36.8 C), Min:98.3 F (36.8 C), Max:98.3 F (36.8 C)  Recent Labs  Lab 11/04/2020 1748  CREATININE 2.80*    CrCl cannot be calculated (Unknown ideal weight.).    No Known Allergies  Antimicrobials this admission: Vanc 1/5 >>  Cefepime 1/5 x1   Dose adjustments this admission:  Microbiology results: 1/5 BCx: pending 1/5 UCx:  pending 1/5 MRSA PCR: pending  Thank you for allowing pharmacy to be a part of this patient's care.  Laverna Peace, PharmD PGY-1 Pharmacy Resident 10/13/2020 6:23 PM Please see AMION for all pharmacy numbers

## 2020-10-14 NOTE — ED Triage Notes (Signed)
Pt BIB EMS from [facility] due to sob, low sp02.on EMS arrival he was 70%. Facility also c/o of pot being altered.

## 2020-10-14 NOTE — ED Provider Notes (Signed)
[facility] EMERGENCY DEPARTMENT Provider Note   CSN: [id number] Arrival date & time: 10/29/2020  1454     History Chief Complaint  Patient presents with  . Altered Mental Status    Paul Lowery is a 70 y.o. male.  The history is provided by the EMS personnel, the nursing home and medical records.   Paul Lowery is a 70 y.o. male who presents to the Emergency Department complaining of shortness of breath and hypoxia. Level V caveat due to confusion. History is provided by EMS and nursing facility. [facility] due to low oxygen. On EMS arrival he was satting a 70%.  Per [facility] notes he was admitted June 11, 2020. History of neoplasm of the supgralottic mass. Made comfort measures. Labs from October 12, 2020. Sodium 132, potassium 4, chloride 99, bicarb 28, BUN 35, creatinine 0.8. CBC WBC 12.8, hemoglobin 7.6, platelet 245  Daughter can be contacted at [phone]  Additional history obtained from [facility] staff after patient's initial ED presentation. Per staff patient with increased confusion today with hypotension and blood pressures in the 70s. At baseline he is typically alert, oriented and interactive. The also reports fever today.   daughter states that patient is not comfort measures. She states that he is DNR but would want IV fluids and IV antibiotics for treatable conditions.  Past Medical History:  Diagnosis Date  . Anemia   . Cancer ([place])   . Coronary artery disease   . Hypertension   . Tracheostomy dependent [facility])     Patient Active Problem List   Diagnosis Date Noted  . Symptomatic anemia 09/25/2020  . CAP (community acquired pneumonia) 09/25/2020  . Acute on chronic respiratory failure with hypoxia ([place]) 09/25/2020  . Chronic diastolic CHF (congestive heart failure) ([place]) 06/25/2020  . Status post tracheostomy ([place]) 06/25/2020  . 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 COPD (chronic obstructive pulmonary disease) ([place]) 06/25/2020  . Essential hypertension 06/25/2020  . Atrial fibrillation, chronic ([place]) 06/25/2020  . Hyperlipidemia 06/25/2020  . Malnutrition of moderate degree 06/25/2020  . Closed comminuted fracture of hip, right, initial encounter ([place]) 06/24/2020    Past Surgical History:  Procedure Laterality Date  . INTRAMEDULLARY (IM) NAIL INTERTROCHANTERIC Right 06/25/2020   Procedure: INTRAMEDULLARY (IM) NAIL INTERTROCHANTRIC;  Surgeon: Hiram Gash, MD;  Location: [facility];  Service: Orthopedics;  Laterality: Right;       History reviewed. No pertinent family history.  Social History   Tobacco Use  . Smoking status: Former Research scientist (life sciences)  . Smokeless tobacco: Never Used    Home Medications Prior to Admission medications   Medication Sig Start Date End Date Taking? Authorizing Provider  acetaminophen (TYLENOL) 325 MG tablet Place 975 mg into feeding tube every 8 (eight) hours.   Yes [provider]  atorvastatin (LIPITOR) 80 MG tablet Place 80 mg into feeding tube daily.   Yes [provider]  budesonide (PULMICORT) 0.5 MG/2ML nebulizer solution Take 0.5 mg by nebulization every 12 (twelve) hours.   Yes [provider]  carvedilol (COREG) 25 MG tablet Place 25 mg into feeding tube in the morning and at bedtime.   Yes [provider]  chlorhexidine (PERIDEX) 0.12 % solution Use as directed 5 mLs in the mouth or throat every 12 (twelve) hours.   Yes [provider]  fluticasone (FLONASE) 50 MCG/ACT nasal spray Place 1 spray into both nostrils every 12 (twelve)  hours as needed for allergies or rhinitis.   Yes [provider]  haloperidol (HALDOL) 5 MG tablet Take 5 mg by mouth daily.   Yes [provider]  HYDROmorphone (DILAUDID) 4 MG tablet Place 4 mg into feeding tube every 6 (six) hours as needed for severe pain.   Yes [provider]  ipratropium-albuterol  (DUONEB) 0.5-2.5 (3) MG/3ML SOLN Take 3 mLs by nebulization every 6 (six) hours.   Yes [provider]  lisinopril (ZESTRIL) 5 MG tablet Take 5 mg by mouth daily.   Yes [provider]  loratadine (CLARITIN) 10 MG tablet Place 10 mg into feeding tube daily.   Yes [provider]  LORazepam (ATIVAN) 1 MG tablet Place 1 mg into feeding tube every 6 (six) hours as needed for anxiety.   Yes [provider]  melatonin 3 MG TABS tablet Place 3 mg into feeding tube at bedtime.   Yes [provider]  Nutritional Supplements (FEEDING SUPPLEMENT, JEVITY 1.5 CAL,) LIQD Place 250 mLs into feeding tube every 4 (four) hours.   Yes [provider]  oxyCODONE (OXY IR/ROXICODONE) 5 MG immediate release tablet Take 1 pills every 6 hrs as needed for pain Patient taking differently: Place 5 mg into feeding tube every 6 (six) hours. 06/28/20  Yes McBane, Jerald Kief, PA-C  Racepinephrine HCl (S2, RACEPINEPHRINE,) 2.25 % NEBU nebulizer solution Take 0.5 mLs by nebulization every 6 (six) hours as needed (shortness of breath, wheezing). Give with 52mL iced saline   Yes [provider]  senna-docusate (SENOKOT-S) 8.6-50 MG tablet Place 1 tablet into feeding tube daily.   Yes [provider]  furosemide (LASIX) 80 MG tablet Place 0.5 tablets (40 mg total) into feeding tube daily. Patient not taking: No sig reported 09/28/20   Alwyn Ren, MD  polyethylene glycol (MIRALAX / GLYCOLAX) 17 g packet Take 17 g by mouth daily as needed for mild constipation. Patient not taking: No sig reported 06/28/20   Pieter Partridge, MD  Water For Irrigation, Sterile (FREE WATER) SOLN Place 175 mLs into feeding tube every 4 (four) hours. Patient not taking: Reported on 10/23/2020 06/28/20   Pieter Partridge, MD    Allergies    Patient has no known allergies.  Review of Systems   Review of Systems  Unable to perform ROS: Mental status change     Physical Exam Updated Vital Signs BP 114/70   Pulse 93   Temp 98.3 F (36.8 C) (Axillary)   Resp 20   SpO2 100%   Physical Exam Vitals and nursing note reviewed.  Constitutional:      General: He is in acute distress.     Appearance: He is well-developed and well-nourished. He is ill-appearing.  HENT:     Head: Normocephalic and atraumatic.  Neck:     Comments: Tracheostomy in the anterior neck. Small amount of fiber of blood present. Cardiovascular:     Rate and Rhythm: Regular rhythm. Tachycardia present.     Heart sounds: No murmur heard.   Pulmonary:     Effort: Pulmonary effort is normal. No respiratory distress.     Comments: Occasional rhonchi Abdominal:     Palpations: Abdomen is soft.     Tenderness: There is no abdominal tenderness. There is no guarding or rebound.     Comments: Peg tube in place  Musculoskeletal:        General: Swelling present. No tenderness or edema.     Comments: One plus  edema to bilateral lower extremities  Skin:    General: Skin is warm and dry.  Neurological:     Comments: Lethargic. Arouses to verbal stimuli. Does not follow commands.  Moves all extremities symmetrically but weakly.  Psychiatric:        Mood and Affect: Mood and affect normal.     Comments: Unable to assess     ED Results / Procedures / Treatments   Labs (all labs ordered are listed, but only abnormal results are displayed) Labs Reviewed  RESP PANEL BY RT-PCR (FLU A&B, COVID) ARPGX2 - Abnormal; Notable for the following components:      Result Value   SARS Coronavirus 2 by RT PCR POSITIVE (*)    All other components within normal limits  LACTIC ACID, PLASMA - Abnormal; Notable for the following components:   Lactic Acid, Venous 2.0 (*)    All other components within normal limits  COMPREHENSIVE METABOLIC PANEL - Abnormal; Notable for the following components:   Sodium 128 (*)    Chloride 95 (*)    BUN 53 (*)    Creatinine, Ser 2.75 (*)    Calcium  8.6 (*)    Albumin 2.0 (*)    AST 92 (*)    Alkaline Phosphatase 364 (*)    Total Bilirubin 1.8 (*)    GFR, Estimated 24 (*)    All other components within normal limits  CBC WITH DIFFERENTIAL/PLATELET - Abnormal; Notable for the following components:   RBC 2.88 (*)    Hemoglobin 7.4 (*)    HCT 23.9 (*)    MCH 25.7 (*)    RDW 19.5 (*)    Lymphs Abs 0.6 (*)    Monocytes Absolute 1.3 (*)    All other components within normal limits  PROTIME-INR - Abnormal; Notable for the following components:   Prothrombin Time 16.1 (*)    INR 1.3 (*)    All other components within normal limits  APTT - Abnormal; Notable for the following components:   aPTT 57 (*)    All other components within normal limits  BRAIN NATRIURETIC PEPTIDE - Abnormal; Notable for the following components:   B Natriuretic Peptide 151.5 (*)    All other components within normal limits  I-STAT CHEM 8, ED - Abnormal; Notable for the following components:   Sodium 131 (*)    Chloride 94 (*)    BUN 50 (*)    Creatinine, Ser 2.80 (*)    Hemoglobin 8.5 (*)    HCT 25.0 (*)    All other components within normal limits  I-STAT VENOUS BLOOD GAS, ED - Abnormal; Notable for the following components:   pO2, Ven 51.0 (*)    Sodium 131 (*)    HCT 26.0 (*)    Hemoglobin 8.8 (*)    All other components within normal limits  CULTURE, BLOOD (SINGLE)  URINE CULTURE  CULTURE, BLOOD (SINGLE)  MRSA PCR SCREENING  LACTIC ACID, PLASMA  URINALYSIS, ROUTINE W REFLEX MICROSCOPIC    EKG EKG Interpretation  Date/Time:  Thursday October 14 2020 16:45:05 EST Ventricular Rate:  87 PR Interval:    QRS Duration: 150 QT Interval:  385 QTC Calculation: 464 R Axis:   -147 Text Interpretation: Atrial fibrillation Nonspecific intraventricular conduction delay Anteroseptal infarct, age indeterminate Confirmed by Tilden Fossaees, Kristal Perl [phone]([id number]) on 10/18/2020 4:47:44 PM   Radiology CT Head Wo Contrast  Result Date: 10/12/2020 CLINICAL DATA:   Altered mental status. EXAM: CT HEAD WITHOUT CONTRAST TECHNIQUE: Contiguous axial images  were obtained from the base of the skull through the vertex without intravenous contrast. COMPARISON:  None. FINDINGS: Brain: There is mild cerebral atrophy with widening of the extra-axial spaces and ventricular dilatation. There are areas of decreased attenuation within the white matter tracts of the supratentorial brain, consistent with microvascular disease changes. CSF attenuation is seen within the region of the sella. Vascular: No hyperdense vessel or unexpected calcification. Skull: Normal. Negative for fracture or focal lesion. Sinuses/Orbits: A 7 mm x 9 mm right-sided frontal sinus polyp versus mucous retention cyst. Other: A 9 mm x 5 mm benign-appearing, well-defined calcified soft tissue nodule is seen within the posterior parietal scalp on the left. IMPRESSION: 1. Generalized cerebral atrophy, without evidence of an acute intracranial abnormality. 2. CSF attenuation within the region of the sella which may represent an empty sella. MRI correlation is recommended. Electronically Signed   By: Virgina Norfolk M.D.   On: 10/29/2020 22:48   DG Chest Port 1 View  Result Date: 10/25/2020 CLINICAL DATA:  Sepsis? Shortness of breath EXAM: PORTABLE CHEST 1 VIEW COMPARISON:  09/25/2020 FINDINGS: Unchanged cardiomegaly. Scattered opacities at the lung bases favored to be atelectasis rather than pneumonia. 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 The tracheostomy cannula again identified. IMPRESSION: Patchy opacities at the lung bases favored to be atelectasis rather than pneumonia. Electronically Signed   By: Miachel Roux M.D.   On: 10/27/2020 15:59    Procedures Procedures (including critical care time) CRITICAL CARE Performed by: Quintella Reichert   Total critical care time: 35 minutes  Critical care time was exclusive of separately billable procedures and treating other patients.  Critical care was necessary  to treat or prevent imminent or life-threatening deterioration.  Critical care was time spent personally by me on the following activities: development of treatment plan with patient and/or surrogate as well as nursing, discussions with consultants, evaluation of patient's response to treatment, examination of patient, obtaining history from patient or surrogate, ordering and performing treatments and interventions, ordering and review of laboratory studies, ordering and review of radiographic studies, pulse oximetry and re-evaluation of patient's condition.  Medications Ordered in ED Medications  vancomycin variable dose per unstable renal function (pharmacist dosing) (has no administration in time range)  sodium chloride 0.9 % bolus 500 mL (has no administration in time range)  ceFEPIme (MAXIPIME) 2 g in sodium chloride 0.9 % 100 mL IVPB (2 g Intravenous New Bag/Given 10/19/2020 2002)  vancomycin (VANCOREADY) IVPB 1750 mg/350 mL (1,750 mg Intravenous New Bag/Given 10/24/2020 2004)    ED Course  I have reviewed the triage vital signs and the nursing notes.  Pertinent labs & imaging results that were available during my care of the patient were reviewed by me and considered in my medical decision making (see chart for details).    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 He is ill appearing on presentation with lethargy. Labs significant for AKI. He tested positive for COVID-19 infection in the emergency department. Discussed these findings with his daughter. She states that he has not been vaccinated. During patient ED stay his mental status did improve and he was able to follow simple commands. He was treated with gentle IV fluid activation. He was also treated with antibiotics for possible pneumonia pending further  workup. Hospitalist consulted for  admission for acute kidney injury and COVID-19 infection with altered mental status.  Paul Lowery was evaluated in Emergency Department on 10/23/2020 for the symptoms described in the history of present illness. He was evaluated in the context of the global COVID-19 pandemic, which necessitated consideration that the patient might be at risk for infection with the SARS-CoV-2 virus that causes COVID-19. Institutional protocols and algorithms that pertain to the evaluation of patients at risk for COVID-19 are in a state of rapid change based on information released by regulatory bodies including the CDC and federal and state organizations. These policies and algorithms were followed during the patient's care in the ED.   Final Clinical Impression(s) / ED Diagnoses Final diagnoses:  AKI (acute kidney injury) ([place])  COVID-19 virus infection    Rx / DC Orders ED Discharge Orders    None       Quintella Reichert, MD 10/15/2020 2324

## 2020-10-15 ENCOUNTER — Inpatient Hospital Stay (HOSPITAL_COMMUNITY): Payer: Medicare Other

## 2020-10-15 DIAGNOSIS — I482 Chronic atrial fibrillation, unspecified: Secondary | ICD-10-CM

## 2020-10-15 DIAGNOSIS — G9341 Metabolic encephalopathy: Secondary | ICD-10-CM

## 2020-10-15 DIAGNOSIS — U071 COVID-19: Secondary | ICD-10-CM

## 2020-10-15 DIAGNOSIS — Z7189 Other specified counseling: Secondary | ICD-10-CM

## 2020-10-15 DIAGNOSIS — J1282 Pneumonia due to coronavirus disease 2019: Secondary | ICD-10-CM

## 2020-10-15 DIAGNOSIS — A419 Sepsis, unspecified organism: Secondary | ICD-10-CM

## 2020-10-15 DIAGNOSIS — N179 Acute kidney failure, unspecified: Secondary | ICD-10-CM | POA: Diagnosis not present

## 2020-10-15 DIAGNOSIS — Z515 Encounter for palliative care: Secondary | ICD-10-CM

## 2020-10-15 LAB — TYPE AND SCREEN
ABO/RH(D): O POS
Antibody Screen: NEGATIVE

## 2020-10-15 LAB — MAGNESIUM: Magnesium: 2.3 mg/dL (ref 1.7–2.4)

## 2020-10-15 LAB — CREATININE, URINE, RANDOM: Creatinine, Urine: 164.99 mg/dL

## 2020-10-15 LAB — SODIUM, URINE, RANDOM: Sodium, Ur: <10 mmol/L

## 2020-10-15 LAB — COMPREHENSIVE METABOLIC PANEL
ALT: 21 U/L (ref 0–44)
AST: 92 U/L — ABNORMAL HIGH (ref 15–41)
Albumin: 2.1 g/dL — ABNORMAL LOW (ref 3.5–5.0)
Alkaline Phosphatase: 390 U/L — ABNORMAL HIGH (ref 38–126)
Anion gap: 14 (ref 5–15)
BUN: 58 mg/dL — ABNORMAL HIGH (ref 8–23)
CO2: 20 mmol/L — ABNORMAL LOW (ref 22–32)
Calcium: 8.5 mg/dL — ABNORMAL LOW (ref 8.9–10.3)
Chloride: 95 mmol/L — ABNORMAL LOW (ref 98–111)
Creatinine, Ser: 3.19 mg/dL — ABNORMAL HIGH (ref 0.61–1.24)
GFR, Estimated: 20 mL/min — ABNORMAL LOW (ref >60)
Glucose, Bld: 92 mg/dL (ref 70–99)
Potassium: 5.5 mmol/L — ABNORMAL HIGH (ref 3.5–5.1)
Sodium: 129 mmol/L — ABNORMAL LOW (ref 135–145)
Total Bilirubin: 2 mg/dL — ABNORMAL HIGH (ref 0.3–1.2)
Total Protein: 7.8 g/dL (ref 6.5–8.1)

## 2020-10-15 LAB — D-DIMER, QUANTITATIVE: D-Dimer, Quant: 19.27 ug/mL-FEU — ABNORMAL HIGH (ref 0.00–0.50)

## 2020-10-15 LAB — FIBRINOGEN: Fibrinogen: 412 mg/dL (ref 210–475)

## 2020-10-15 LAB — URIC ACID: Uric Acid, Serum: 12.4 mg/dL — ABNORMAL HIGH (ref 3.7–8.6)

## 2020-10-15 LAB — VITAMIN B12: Vitamin B-12: 3867 pg/mL — ABNORMAL HIGH (ref 180–914)

## 2020-10-15 LAB — OSMOLALITY, URINE: Osmolality, Ur: 355 mOsm/kg (ref 300–900)

## 2020-10-15 LAB — C-REACTIVE PROTEIN: CRP: 29 mg/dL — ABNORMAL HIGH (ref <1.0)

## 2020-10-15 LAB — RETICULOCYTES
Immature Retic Fract: 14.6 % (ref 2.3–15.9)
RBC.: 3.06 MIL/uL — ABNORMAL LOW (ref 4.22–5.81)
Retic Count, Absolute: 26.9 10*3/uL (ref 19.0–186.0)
Retic Ct Pct: 0.9 % (ref 0.4–3.1)

## 2020-10-15 LAB — FERRITIN: Ferritin: 209 ng/mL (ref 24–336)

## 2020-10-15 LAB — IRON AND TIBC
Iron: 11 ug/dL — ABNORMAL LOW (ref 45–182)
Saturation Ratios: 5 % — ABNORMAL LOW (ref 17.9–39.5)
TIBC: 202 ug/dL — ABNORMAL LOW (ref 250–450)
UIBC: 191 ug/dL

## 2020-10-15 LAB — FOLATE: Folate: 19.6 ng/mL (ref >5.9)

## 2020-10-15 LAB — PROCALCITONIN: Procalcitonin: 0.54 ng/mL

## 2020-10-15 LAB — BRAIN NATRIURETIC PEPTIDE: B Natriuretic Peptide: 186.1 pg/mL — ABNORMAL HIGH (ref 0.0–100.0)

## 2020-10-15 LAB — OSMOLALITY: Osmolality: 297 mOsm/kg — ABNORMAL HIGH (ref 275–295)

## 2020-10-15 MED ORDER — PREDNISONE 20 MG PO TABS: 50.0000 mg | ORAL_TABLET | Freq: Every day | ORAL | Status: DC

## 2020-10-15 MED ORDER — CARVEDILOL 6.25 MG PO TABS
6.2500 mg | ORAL_TABLET | Freq: Two times a day (BID) | ORAL | Status: DC
  Administered 2020-10-15 – 2020-10-16 (×3): 6.25 mg
  Filled 2020-10-15 (×3): qty 1

## 2020-10-15 MED ORDER — IPRATROPIUM-ALBUTEROL 20-100 MCG/ACT IN AERS
1.0000 | INHALATION_SPRAY | Freq: Four times a day (QID) | RESPIRATORY_TRACT | Status: DC
  Filled 2020-10-15: qty 4

## 2020-10-15 MED ORDER — LORAZEPAM 0.5 MG PO TABS
0.5000 mg | ORAL_TABLET | Freq: Four times a day (QID) | ORAL | Status: DC | PRN
  Administered 2020-10-15 – 2020-10-16 (×2): 0.5 mg
  Filled 2020-10-15 (×2): qty 1

## 2020-10-15 MED ORDER — PANTOPRAZOLE SODIUM 40 MG IV SOLR
40.0000 mg | INTRAVENOUS | Status: DC
  Administered 2020-10-15 – 2020-10-16 (×2): 40 mg via INTRAVENOUS
  Filled 2020-10-15 (×2): qty 40

## 2020-10-15 MED ORDER — HALOPERIDOL 5 MG PO TABS
5.0000 mg | ORAL_TABLET | Freq: Every day | ORAL | Status: DC
  Filled 2020-10-15 (×2): qty 1

## 2020-10-15 MED ORDER — SODIUM CHLORIDE 0.9 % IV SOLN: INTRAVENOUS | Status: DC

## 2020-10-15 MED ORDER — PANTOPRAZOLE SODIUM 40 MG PO TBEC: 40.0000 mg | DELAYED_RELEASE_TABLET | Freq: Every day | ORAL | Status: DC

## 2020-10-15 MED ORDER — HALOPERIDOL LACTATE 5 MG/ML IJ SOLN
5.0000 mg | Freq: Four times a day (QID) | INTRAMUSCULAR | Status: DC | PRN
  Administered 2020-10-15 – 2020-10-16 (×2): 5 mg via INTRAVENOUS
  Filled 2020-10-15 (×2): qty 1

## 2020-10-15 MED ORDER — METHYLPREDNISOLONE SODIUM SUCC 125 MG IJ SOLR
60.0000 mg | Freq: Two times a day (BID) | INTRAMUSCULAR | Status: DC
  Administered 2020-10-15 (×2): 60 mg via INTRAVENOUS
  Filled 2020-10-15 (×2): qty 2

## 2020-10-15 MED ORDER — ACETAMINOPHEN 325 MG PO TABS: 650.0000 mg | ORAL_TABLET | Freq: Four times a day (QID) | ORAL | Status: DC | PRN

## 2020-10-15 MED ORDER — MORPHINE SULFATE (PF) 2 MG/ML IV SOLN
2.0000 mg | INTRAVENOUS | Status: DC | PRN
  Administered 2020-10-15 – 2020-10-16 (×2): 2 mg via INTRAVENOUS
  Filled 2020-10-15 (×2): qty 1

## 2020-10-15 MED ORDER — METHYLPREDNISOLONE SODIUM SUCC 40 MG IJ SOLR
40.0000 mg | Freq: Two times a day (BID) | INTRAMUSCULAR | Status: DC
  Administered 2020-10-15: 40 mg via INTRAVENOUS
  Filled 2020-10-15: qty 1

## 2020-10-15 MED ORDER — SENNOSIDES-DOCUSATE SODIUM 8.6-50 MG PO TABS
1.0000 | ORAL_TABLET | Freq: Every day | ORAL | Status: DC
  Administered 2020-10-16: 1
  Filled 2020-10-15: qty 1

## 2020-10-15 MED ORDER — HALOPERIDOL LACTATE 5 MG/ML IJ SOLN
2.0000 mg | Freq: Four times a day (QID) | INTRAMUSCULAR | Status: DC | PRN
  Administered 2020-10-15: 2 mg via INTRAVENOUS
  Filled 2020-10-15: qty 1

## 2020-10-15 MED ORDER — POLYETHYLENE GLYCOL 3350 17 G PO PACK: 17.0000 g | PACK | Freq: Every day | ORAL | Status: DC | PRN

## 2020-10-15 MED ORDER — ATORVASTATIN CALCIUM 80 MG PO TABS
80.0000 mg | ORAL_TABLET | Freq: Every day | ORAL | Status: DC
  Administered 2020-10-15 – 2020-10-16 (×2): 80 mg
  Filled 2020-10-15 (×2): qty 1

## 2020-10-15 NOTE — ED Notes (Signed)
Patient found sitting at end of bed with telemetry leads removed, covered in feces. Patient cleaned and returned to bed. Despite constant redirection, patient continues to move towards the end of the bed. Patient use call light every few minutes for suctioning, pointing to his belly and back. This RN needing to be in patient's room nearly constantly due to patient calling out. Dr. Marlowe Sax notified, medication requested to help calm pt and/or treat pain. MD states she will assess patient.

## 2020-10-15 NOTE — ED Notes (Signed)
Pt airwway sounds gurgly.  RT called to suction

## 2020-10-15 NOTE — ED Notes (Addendum)
Entered room to find pt had detached tube from trach collar and was satting 85%.  Tube wqas reconnedcted.  Sts rebounded to 95%.  Pt is on 5L with trach collar

## 2020-10-15 NOTE — H&P (Addendum)
History and Physical    Maurice Fotheringham DTO:[id number] DOB: 12/25/1950 DOA: 11/01/2020  PCP: Benito Mccreedy, MD Patient coming from: [facility]  Chief Complaint: Shortness of breath, low oxygen saturation  HPI: Paul Lowery is a 70 y.o. male with medical history significant of CAD, hypertension, hyperlipidemia, advanced supraglottic squamous cell cancer followed at [facility], chronic hypercapnic respiratory failure and COPD with chronic trach with chronic bleeding/oozing from trach site follows at [facility], G-tube, CAD status post CABG, A. fib off Eliquis for the past year, chronic diastolic heart failure, hip fracture status post repair in September 2021, chronic anemia presenting to the ED from [facility] for evaluation of shortness of breath and low oxygen saturation. He was satting 70% at the time of EMS arrival. Staff at the facility reported that he has had increased confusion today with hypotension with blood pressure in the 70s. At baseline, he is typically alert, oriented, and interactive. They also reported fever today. ED provider spoke to the patient's daughter who stated that he is DNR but would want IV fluids and IV antibiotics for treatable conditions. Daughter reported that patient is not vaccinated against Covid.  No history could be obtained from the patient.  ED Course: Afebrile. Satting well on 5 L supplemental oxygen via trach collar which is his baseline. Not hypotensive. SARS-CoV-2 PCR test positive. Influenza panel negative. WBC 7.5, hemoglobin 7.4 (baseline 7-8 range), platelet count 269K. Sodium 128, potassium 4.9, chloride 95, bicarb 24, BUN 53, creatinine 2.7 (baseline 0.7-0.9), glucose 95. AST 92, ALT 21, alk phos 364, T bili 1.8. Blood culture x2 pending. UA and urine culture pending. INR 1.3. VBG with pH 7.36. BNP 151. Lactic acid 2.0.  Chest x-ray showing patchy opacities at the lung bases.  Head CT negative for acute intracranial abnormality. Showing CSF attenuation  within the region of the sella which may represent an empty sella, MRI correlation is recommended.  Patient was given vancomycin, cefepime, remdesivir, and a 500 cc normal saline bolus.  Review of Systems:  All systems reviewed and apart from history of presenting illness, are negative.  Past Medical History:  Diagnosis Date  . Anemia   . Cancer ([place])   . Coronary artery disease   . Hypertension   . Tracheostomy dependent [facility])     Past Surgical History:  Procedure Laterality Date  . INTRAMEDULLARY (IM) NAIL INTERTROCHANTERIC Right 06/25/2020   Procedure: INTRAMEDULLARY (IM) NAIL INTERTROCHANTRIC;  Surgeon: Hiram Gash, MD;  Location: [facility];  Service: Orthopedics;  Laterality: Right;     reports that he has quit smoking. He has never used smokeless tobacco. No history on file for alcohol use and drug use.  No Known Allergies  History reviewed. No pertinent family history.  Prior to Admission medications   Medication Sig Start Date End Date Taking? Authorizing Provider  acetaminophen (TYLENOL) 325 MG tablet Place 975 mg into feeding tube every 8 (eight) hours.   Yes [provider]  atorvastatin (LIPITOR) 80 MG tablet Place 80 mg into feeding tube daily.   Yes [provider]  budesonide (PULMICORT) 0.5 MG/2ML nebulizer solution Take 0.5 mg by nebulization every 12 (twelve) hours.   Y es [provider]  carvedilol (COREG) 25 MG tablet Place 25 mg into feeding tube in the morning and at bedtime.   Yes [provider]  chlorhexidine (PERIDEX) 0.12 % solution Use as directed 5 mLs in the mouth or throat every 12 (twelve) hours.   Yes [provider]  fluticasone Asencion Islam)  50 MCG/ACT nasal spray Place 1 spray into both nostrils every 12 (twelve) hours as needed for allergies or rhinitis.   Yes [provider]  haloperidol (HALDOL) 5 MG tablet Take 5 mg by mouth daily.   Yes [provider]  HYDROmorphone (DILAUDID) 4 MG  tablet Place 4 mg into feeding tube every 6 (six) hours as needed for severe pain.   Yes [provider]  ipratropium-albuterol (DUONEB) 0.5-2.5 (3) MG/3ML SOLN Take 3 mLs by nebulization every 6 (six) hours.   Yes [provider]  lisinopril (ZESTRIL) 5 MG tablet Take 5 mg by mouth daily.   Yes [provider]  loratadine (CLARITIN) 10 MG tablet Place 10 mg into feeding tube daily.   Yes [provider]  LORazepam (ATIVAN) 1 MG tablet Place 1 mg into feeding tube every 6 (six) hours as needed for anxiety.   Yes [provider]  melatonin 3 MG TABS tablet Place 3 mg into feeding tube at bedtime.   Yes [provider]  Nutritional Supplements (FEEDING SUPPLEMENT, JEVITY 1.5 CAL,) LIQD Place 250 mLs into feeding tube every 4 (four) hours.   Yes [provider]  oxyCODONE (OXY IR/ROXICODONE) 5 MG immediate release tablet Take 1 pills every 6 hrs as needed for pain Patient taking differently: Place 5 mg into feeding tube every 6 (six) hours. 06/28/20  Yes McBane, Maylene Roes, PA-C  Racepinephrine HCl (S2, RACEPINEPHRINE,) 2.25 % NEBU nebulizer solution Take 0.5 mLs by nebulization every 6 (six) hours as needed (shortness of breath, wheezing). Give with 2mL iced saline   Yes [provider]  senna-docusate (SENOKOT-S) 8.6-50 MG tablet Place 1 tablet into feeding tube daily.   Yes [provider]  furosemide (LASIX) 80 MG tablet Place 0.5 tablets (40 mg total) into feeding tube daily. Patient not taking: No sig reported 09/28/20   Georgette Shell, MD  polyethylene glycol (MIRALAX / GLYCOLAX) 17 g packet Take 17 g by mouth daily as needed for mild constipation. Patient not taking: No sig reported 06/28/20   Vashti Hey, MD  Water For Irrigation, Sterile (FREE WATER) SOLN Place 175 mLs into feeding tube every 4 (four) hours. Patient not taking: Reported on 10/22/2020 06/28/20   Vashti Hey, MD     Physical Exam: Vitals:   11/06/2020 2145 10/13/2020 2200 10/09/2020 2315 10/15/20 0009  BP: 109/66 114/70 130/75 (!) 119/59  Pulse: 91 93 91 96  Resp: 20 20 (!) 21 19  Temp:      TempSrc:      SpO2: 100% 100% 100% 99%    Physical Exam Constitutional:      General: He is not in acute distress. HENT:     Head: Normocephalic and atraumatic.  Eyes:     Extraocular Movements: Extraocular movements intact.     Conjunctiva/sclera: Conjunctivae normal.  Neck:     Comments: Slight oozing of blood from the trach collar Cardiovascular:     Rate and Rhythm: Normal rate and regular rhythm.     Pulses: Normal pulses.  Pulmonary:     Effort: Pulmonary effort is normal.     Breath sounds: No wheezing.     Comments: Bibasilar rales Abdominal:     General: Bowel sounds are normal. There is no distension.     Palpations: Abdomen is soft.     Tenderness: There is no abdominal tenderness.  Musculoskeletal:     Cervical back: Normal range of motion and neck supple.  Right lower leg: Edema present.     Left lower leg: Edema present.     Comments: +2 pedal edema bilaterally  Skin:    General: Skin is warm and dry.  Neurological:     Mental Status: He is alert.     Comments: Awake and alert Moving all extremities on command.     Labs on Admission: I have personally reviewed following labs and imaging studies  CBC: Recent Labs  Lab 10/20/2020 1718 11/02/2020 1747 10/25/2020 1748  WBC 7.5  --   --   NEUTROABS 5.6  --   --   HGB 7.4* 8.8* 8.5*  HCT 23.9* 26.0* 25.0*  MCV 83.0  --   --   PLT 269  --   --    Basic Metabolic Panel: Recent Labs  Lab 10/16/2020 1718 10/23/2020 1747 11/04/2020 1748  NA 128* 131* 131*  K 4.9 5.0 5.0  CL 95*  --  94*  CO2 24  --   --   GLUCOSE 95  --  95  BUN 53*  --  50*  CREATININE 2.75*  --  2.80*  CALCIUM 8.6*  --   --    GFR: CrCl cannot be calculated (Unknown ideal weight.). Liver Function Tests: Recent Labs  Lab 10/16/2020 1718  AST 92*   ALT 21  ALKPHOS 364*  BILITOT 1.8*  PROT 7.7  ALBUMIN 2.0*   No results for input(s): LIPASE, AMYLASE in the last 168 hours. No results for input(s): AMMONIA in the last 168 hours. Coagulation Profile: Recent Labs  Lab 10/27/2020 1718  INR 1.3*   Cardiac Enzymes: No results for input(s): CKTOTAL, CKMB, CKMBINDEX, TROPONINI in the last 168 hours. BNP (last 3 results) No results for input(s): PROBNP in the last 8760 hours. HbA1C: No results for input(s): HGBA1C in the last 72 hours. CBG: No results for input(s): GLUCAP in the last 168 hours. Lipid Profile: No results for input(s): CHOL, HDL, LDLCALC, TRIG, CHOLHDL, LDLDIRECT in the last 72 hours. Thyroid Function Tests: No results for input(s): TSH, T4TOTAL, FREET4, T3FREE, THYROIDAB in the last 72 hours. Anemia Panel: No results for input(s): VITAMINB12, FOLATE, FERRITIN, TIBC, IRON, RETICCTPCT in the last 72 hours. Urine analysis: No results found for: COLORURINE, APPEARANCEUR, [place], [place], GLUCOSEU, HGBUR, BILIRUBINUR, KETONESUR, PROTEINUR, UROBILINOGEN, NITRITE, LEUKOCYTESUR  Radiological Exams on Admission: CT Head Wo Contrast  Result Date: 10/16/2020 CLINICAL DATA:  Altered mental status. EXAM: CT HEAD WITHOUT CONTRAST TECHNIQUE: Contiguous axial images were obtained from the base of the skull through the vertex without intravenous contrast. COMPARISON:  None. FINDINGS: Brain: There is mild cerebral atrophy with widening of the extra-axial spaces and ventricular dilatation. There are areas of decreased attenuation within the white matter tracts of the supratentorial brain, consistent with microvascular disease changes. CSF attenuation is seen within the region of the sella. Vascular: No hyperdense vessel or unexpected calcification. Skull: Normal. Negative for fracture or focal lesion. Sinuses/Orbits: A 7 mm x 9 mm right-sided frontal sinus polyp versus mucous retention cyst. Other: A 9 mm x 5 mm benign-appearing,  well-defined calcified soft tissue nodule is seen within the posterior parietal scalp on the left. IMPRESSION: 1. Generalized cerebral atrophy, without evidence of an acute intracranial abnormality. 2. CSF attenuation within the region of the sella which may represent an empty sella. MRI correlation is recommended. Electronically Signed   By: Virgina Norfolk M.D.   On: 10/11/2020 22:48   DG Chest Port 1 View  Result Date: 10/11/2020 CLINICAL DATA:  Sepsis? Shortness of breath EXAM: PORTABLE CHEST 1 VIEW COMPARISON:  09/25/2020 FINDINGS: Unchanged cardiomegaly. Scattered opacities at the lung bases favored to be atelectasis rather than pneumonia. 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 The tracheostomy cannula again identified. IMPRESSION: Patchy opacities at the lung bases favored to be atelectasis rather than pneumonia. Electronically Signed   By: Miachel Roux M.D.   On: 10/24/2020 15:59    EKG: Independently reviewed. A. fib, RBBB, LAFB. No significant change compared to prior tracing for December 2021.  Assessment/Plan Principal Problem:   Pneumonia due to COVID-19 virus Active Problems:   Atrial fibrillation, chronic (HCC)   Sepsis ([place])   AKI (acute kidney injury) ([place])   Acute metabolic encephalopathy   Sepsis secondary to COVID-19 viral pneumonia: Patient is unvaccinated. SARS-CoV-2 PCR test positive. Chest x-ray showing patchy opacities at the lung bases, suspect due to COVID-19 viral pneumonia. Meets criteria for sepsis -fever reported at nursing home, mild tachycardia and tachypnea, borderline lactic acidosis on labs. -Remdesivir -IV Solu-Medrol 0.5 mg/kg every 12 hours -Unable to discuss use/contraindications of baricitinib/Actemra with the patient.  Consider discussing with family in the morning. -Received a small fluid bolus in the ED, repeat lactate. -Patient received vancomycin and cefepime in the ED. Suspicion for bacterial pneumonia is low given no leukocytosis. Check  procalcitonin level. -Vitamin C, zinc, vitamin D -Antitussives as needed -Tylenol as needed -Bronchodilator -Check inflammatory markers including ferritin, fibrinogen, D-dimer, CRP, LDH -Trend lactate -Daily CBC with differential, CMP, CRP, D-dimer -Airborne and contact precautions -Incentive spirometry, flutter valve -Encourage prone positioning -Continuous pulse ox -Supplemental oxygen as needed to keep oxygen saturation above 90% -Blood culture x2 pending  AKI: BUN 53, creatinine 2.7 (baseline 0.7-0.9). Likely prerenal due to dehydration in the setting of acute viral illness. -Gentle IV fluid hydration. Monitor renal function and urine output. Avoid nephrotoxic agents/contrast. Order renal ultrasound. Check urine sodium and creatinine.  Acute metabolic encephalopathy: Suspect due to COVID-19 viral infection. Head CT negative for acute intracranial abnormality. Showing CSF attenuation within the region of the sella which may represent an empty sella, MRI correlation is recommended.  Currently awake and alert, moving all extremities on command.  No nuchal rigidity. -Continue management of Covid infection. Brain MRI ordered.  Acute on chronic respiratory failure with trach: Hypoxic to the 70s at his facility. Patient is currently satting well on 5 L supplemental oxygen via trach collar.  Per discharge summary from recent hospitalization, he was on 5 L supplemental oxygen. -Continue supplemental oxygen, RT consulted for trach care  Chronic A. fib: Off Eliquis for the past year in setting of chronic bleeding/oozing from trach site.  -Pharmacy med rec pending.  Chronic anemia: Hemoglobin 7.4, at baseline. -Continue to monitor  Acute on chronic mild hyponatremia: Possibly due to poor oral intake in the setting of acute viral illness. Sodium 128, previously in the low to mid 130s. -Gentle IV fluid hydration, check serum osmolarity, monitor sodium level  Abnormal LFTs: AST 92, ALT 21, alk  phos 364, T bili 1.8.  -Right upper quadrant ultrasound  DVT prophylaxis: SCDs given chronic bleeding from trach site Code Status: DNR Disposition Plan: Status is: Inpatient  Remains inpatient appropriate because:IV treatments appropriate due to intensity of illness or inability to take PO, Inpatient level of care appropriate due to severity of illness and COVID-19 viral pneumonia   Dispo: The patient is from: SNF              Anticipated d/c is to: SNF  Anticipated d/c date is: > 3 days              Patient currently is not medically stable to d/c.  The medical decision making on this patient was of high complexity and the patient is at high risk for clinical deterioration, therefore this is a level 3 visit.  Shela Leff MD [facility]  If 7PM-7AM, please contact night-coverage [email or web address]  10/15/2020, 2:57 AM

## 2020-10-15 NOTE — ED Notes (Signed)
Patient found with trach collar removed and at edge of bed. Patient SpO2 86% on monitor. Trach collar applied, patient returned to bed. SpO2 increased to 94%%. RT called to assess and suction airway. Dr. Candiss Norse updated, states he will come to bedside to assess.

## 2020-10-15 NOTE — ED Notes (Signed)
Unable to obtain labs at this time, patient requiring constant need for redirection. Continues to try to remove trach collar and move to end of bed, will not stay still long enough for labs to be obtained.

## 2020-10-15 NOTE — Progress Notes (Signed)
PROGRESS NOTE                                                                                                                                                                                                             Patient Demographics:    Paul Lowery, is a 70 y.o. male, DOB - 1950-11-23, [id number]  Outpatient Primary MD for the patient is Osei-Bonsu, Iona Beard, MD    LOS - 1  Admit date - 10/13/2020    Chief Complaint  Patient presents with  . Altered Mental Status       Brief Narrative (HPI from H&P) Paul Lowery is a 70 y.o. male with medical history significant of CAD, hypertension, hyperlipidemia, advanced supraglottic squamous cell cancer followed at [facility], chronic hypercapnic respiratory failure and COPD with chronic trach with chronic bleeding/oozing from trach site follows at [facility], G-tube, CAD status post CABG, A. fib off Eliquis for the past year, chronic diastolic heart failure, hip fracture status post repair in September 2021, who is DNR and has a MOST form at baseline was brought in from [facility] for hypoxia, blood oozing from tracheostomy, being slightly more confused than baseline.  Here he was found positive for COVID-19 pneumonia with mild hypoxia, bleeding from the trach site acute on chronic, anemia, dehydration with AKI.   Subjective:    Micha Matson today somewhat confused, unable to reliably answer questions or follow commands.   Assessment  & Plan :     1. Acute on chronic hypoxic Resp. Failure due to Acute Covid 19 Viral Pneumonitis  - his mineralization status is unknown, appears to have mild to moderate parenchymal lung injury, already has a trach and being oxygenated via the trach collar.  Neuroid's and Remdesivir.  Poor candidate for Actemra Baricitinib due to multiple severe comorbidities and poor prognostic status at baseline.  Encouraged the patient to sit up in chair in the  daytime use I-S and flutter valve for pulmonary toiletry and then prone in bed when at night.  Will advance activity and titrate down oxygen as possible.    SpO2: 98 % O2 Flow Rate (L/min): 5 L/min FiO2 (%): 28 %  Recent Labs  Lab 10/23/2020 1718 10/12/2020 1747 10/12/2020 1748 11/01/2020 1755 10/19/2020 1805  WBC 7.5  --   --   --   --   HGB  7.4* 8.8* 8.5*  --   --   HCT 23.9* 26.0* 25.0*  --   --   PLT 269  --   --   --   --   BNP  --   --   --  151.5*  --   AST 92*  --   --   --   --   ALT 21  --   --   --   --   ALKPHOS 364*  --   --   --   --   BILITOT 1.8*  --   --   --   --   ALBUMIN 2.0*  --   --   --   --   INR 1.3*  --   --   --   --   LATICACIDVEN  --   --   --   --  2.0*  SARSCOV2NAA POSITIVE*  --   --   --   --     2.  Severe dehydration with hyponatremia and AKI.  Hydrate with IV fluids.  3.  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  DNR with baseline MOST form.  Extremely poor prognosis discussed with daughter, supportive care, if any significant decline full comfort measures.  4.  Anemia.  Due to acute on chronic intermittent bleeding from the trach site.  If needed packed RBC infusion, discussed with daughter.  Type screen and monitor.  5.  Hypertension and dyslipidemia.  Coreg and statin through the PEG tube.   6 . Acute on chronic metabolic encephalopathy.  Haldol and Ativan. C T head nonacute.  7.  Paroxysmal A. fib.  Beta-blocker.  Goal rate control.  Not anticoagulation candidate due to constant trach site bleeding.        Condition - Extremely Guarded  Family Communication  :   Daughter [phone] - 10/15/20  Code Status :  DNR  Consults  : Palliative care  Procedures  :    Right upper quadrant ultrasound.  Liver Mets  CT head. Non acute  PUD Prophylaxis : PPI  Disposition Plan  :    Status is: Inpatient  Remains inpatient appropriate because:IV treatments appropriate due to  intensity of illness or inability to take PO   Dispo: The patient is from: SNF              Anticipated d/c is to: SNF              Anticipated d/c date is: 2 days              Patient currently is not medically stable to d/c.   DVT Prophylaxis  :   SCDs    Lab Results  Component Value Date   PLT 269 10/21/2020    Diet :  Diet Order            Diet NPO time specified  Diet effective now                  Inpatient Medications  Scheduled Meds: . atorvastatin  80 mg Per Tube Daily  . carvedilol  6.25 mg Per Tube BID WC  . haloperidol  5 mg Oral Daily  . Ipratropium-Albuterol  1 puff Inhalation Q6H  . methylPREDNISolone (SOLU-MEDROL) injection  60 mg Intravenous Q12H  . pantoprazole  40 mg Oral Daily  . senna-docusate  1 tablet Per Tube Daily   Continuous Infusions: .  sodium chloride 100 mL/hr at 10/15/20 0755  . [START ON 10/16/2020] remdesivir 100 mg in NS 100 mL     PRN Meds:.acetaminophen, haloperidol lactate, LORazepam, polyethylene glycol  Antibiotics  :    Anti-infectives (From admission, onward)   Start     Dose/Rate Route Frequency Ordered Stop   10/16/20 1000  remdesivir 100 mg in sodium chloride 0.9 % 100 mL IVPB       "Followed by" Linked Group Details   100 mg 200 mL/hr over 30 Minutes Intravenous Daily 11/03/2020 2355 10/20/20 0959   10/15/20 0030  remdesivir 200 mg in sodium chloride 0.9% 250 mL IVPB       "Followed by" Linked Group Details   200 mg 580 mL/hr over 30 Minutes Intravenous Once 10/31/2020 2355 10/15/20 0520   11/08/2020 1830  vancomycin (VANCOREADY) IVPB 1750 mg/350 mL        1,750 mg 175 mL/hr over 120 Minutes Intravenous  Once 10/20/2020 1809 10/15/20 0114   10/10/2020 1819  vancomycin variable dose per unstable renal function (pharmacist dosing)  Status:  Discontinued         Does not apply See admin instructions 11/07/2020 1820 10/15/20 0300   10/18/2020 1815  ceFEPIme (MAXIPIME) 2 g in sodium chloride 0.9 % 100 mL IVPB        2 g 200 mL/hr  over 30 Minutes Intravenous  Once 10/15/2020 1802 10/15/20 0114   10/15/2020 1800  ceFEPIme (MAXIPIME) 1 g in sodium chloride 0.9 % 100 mL IVPB  Status:  Discontinued        1 g 200 mL/hr over 30 Minutes Intravenous  Once 17-Oct-2020 1755 11/06/2020 1802       Time Spent in minutes  30   Lala Lund M.D on 10/15/2020 at 9:32 AM  To page go to [email or web address]   [facility] -  Office  [phone]  See all Orders from today for further details    Objective:   Vitals:   10/15/20 0800 10/15/20 0815 10/15/20 0822 10/15/20 0830  BP: 129/76 127/75  118/79  Pulse: 97 (!) 114  (!) 109  Resp: (!) 22 (!) 30  (!) 34  Temp:   98.5 F (36.9 C)   TempSrc:   Oral   SpO2: 94% 97%  98%  Weight:      Height:        Wt Readings from Last 3 Encounters:  10/15/20 81.6 kg  09/25/20 97 kg  06/25/20 106.1 kg    No intake or output data in the 24 hours ending 10/15/20 0932   Physical Exam  Awake but somewhat confused elderly African-American male in no distress, [place].AT,PERRAL Supple Neck, trach site in place with some bloody discharge Symmetrical Chest wall movement, Good air movement bilaterally, CTAB RRR,No Gallops,Rubs or new Murmurs, No Parasternal Heave +ve B.Sounds, Abd Soft, No tenderness, No organomegaly appriciated, No rebound - guarding or rigidity. No Cyanosis, Clubbing or edema, No new Rash or bruise       Data Review:    CBC Recent Labs  Lab 17-Oct-2020 1718 October 17, 2020 1747 10/10/2020 1748  WBC 7.5  --   --   HGB 7.4* 8.8* 8.5*  HCT 23.9* 26.0* 25.0*  PLT 269  --   --   MCV 83.0  --   --   MCH 25.7*  --   --   MCHC 31.0  --   --   RDW 19.5*  --   --   LYMPHSABS 0.6*  --   --  MONOABS 1.3*  --   --   EOSABS 0.0  --   --   BASOSABS 0.0  --   --     Recent Labs  Lab 10-18-2020 1718 October 18, 2020 1747 Oct 18, 2020 1748 10/18/2020 1755 10/18/2020 1805  NA 128* 131* 131*  --   --   K 4.9 5.0 5.0  --   --   CL 95*  --  94*  --   --   CO2 24  --   --   --   --   GLUCOSE 95   --  95  --   --   BUN 53*  --  50*  --   --   CREATININE 2.75*  --  2.80*  --   --   CALCIUM 8.6*  --   --   --   --   AST 92*  --   --   --   --   ALT 21  --   --   --   --   ALKPHOS 364*  --   --   --   --   BILITOT 1.8*  --   --   --   --   ALBUMIN 2.0*  --   --   --   --   LATICACIDVEN  --   --   --   --  2.0*  INR 1.3*  --   --   --   --   BNP  --   --   --  151.5*  --     ------------------------------------------------------------------------------------------------------------------ No results for input(s): CHOL, HDL, LDLCALC, TRIG, CHOLHDL, LDLDIRECT in the last 72 hours.  No results found for: HGBA1C ------------------------------------------------------------------------------------------------------------------ No results for input(s): TSH, T4TOTAL, T3FREE, THYROIDAB in the last 72 hours.  Invalid input(s): FREET3  Cardiac Enzymes No results for input(s): CKMB, TROPONINI, MYOGLOBIN in the last 168 hours.  Invalid input(s): CK ------------------------------------------------------------------------------------------------------------------    Component Value Date/Time   BNP 151.5 (H) 18-Oct-2020 1755    Micro Results Recent Results (from the past 240 hour(s))  Resp Panel by RT-PCR (Flu A&B, Covid) Nasopharyngeal Swab     Status: Abnormal   Collection Time: 2020/10/18  5:18 PM   Specimen: Nasopharyngeal Swab; Nasopharyngeal(NP) swabs in vial transport medium  Result Value Ref Range Status   SARS Coronavirus 2 by RT PCR POSITIVE (A) NEGATIVE Final    Comment: RESULT CALLED TO, READ BACK BY AND VERIFIED WITH: A COSTALES RN 1859 2020/10/18 A BROWNING (NOTE) SARS-CoV-2 target nucleic acids are DETECTED.  The SARS-CoV-2 RNA is generally detectable in upper respiratory specimens during the acute phase of infection. Positive results are indicative of the presence of the identified virus, but do not rule out bacterial infection or co-infection with other pathogens  not detected by the test. Clinical correlation with patient history and other diagnostic information is necessary to determine patient infection status. The expected result is Negative.  Fact Sheet for Patients: EntrepreneurPulse.com.au  Fact Sheet for Healthcare Providers: IncredibleEmployment.be  This test is not yet approved or cleared by the [place] FDA and  has been authorized for detection and/or diagnosis of SARS-CoV-2 by FDA under an Emergency Use Authorization (EUA).  This EUA will remain in effect (meaning this test can  be used) for the duration of  the COVID-19 declaration under Section 564(b)(1) of the Act, 21 U.S.C. section 360bbb-3(b)(1), unless the authorization is terminated or revoked sooner.     Influenza A by PCR NEGATIVE NEGATIVE Final  Influenza B by PCR NEGATIVE NEGATIVE Final    Comment: (NOTE) The Xpert Xpress SARS-CoV-2/FLU/RSV plus assay is intended as an aid in the diagnosis of influenza from Nasopharyngeal swab specimens and should not be used as a sole basis for treatment. Nasal washings and aspirates are unacceptable for Xpert Xpress SARS-CoV-2/FLU/RSV testing.  Fact Sheet for Patients: EntrepreneurPulse.com.au  Fact Sheet for Healthcare Providers: IncredibleEmployment.be  This test is not yet approved or cleared by the [place] FDA and has been authorized for detection and/or diagnosis of SARS-CoV-2 by FDA under an Emergency Use Authorization (EUA). This EUA will remain in effect (meaning this test can be used) for the duration of the COVID-19 declaration under Section 564(b)(1) of the Act, 21 U.S.C. section 360bbb-3(b)(1), unless the authorization is terminated or revoked.  Performed at [facility], [place] [address] [place], [place] [address]     Radiology Reports CT Head Wo Contrast  Result Date: 10/16/2020 CLINICAL DATA:  Altered mental status.  EXAM: CT HEAD WITHOUT CONTRAST TECHNIQUE: Contiguous axial images were obtained from the base of the skull through the vertex without intravenous contrast. COMPARISON:  None. FINDINGS: Brain: There is mild cerebral atrophy with widening of the extra-axial spaces and ventricular dilatation. There are areas of decreased attenuation within the white matter tracts of the supratentorial brain, consistent with microvascular disease changes. CSF attenuation is seen within the region of the sella. Vascular: No hyperdense vessel or unexpected calcification. Skull: Normal. Negative for fracture or focal lesion. Sinuses/Orbits: A 7 mm x 9 mm right-sided frontal sinus polyp versus mucous retention cyst. Other: A 9 mm x 5 mm benign-appearing, well-defined calcified soft tissue nodule is seen within the posterior parietal scalp on the left. IMPRESSION: 1. Generalized cerebral atrophy, without evidence of an acute intracranial abnormality. 2. CSF attenuation within the region of the sella which may represent an empty sella. MRI correlation is recommended. Electronically Signed   By: Virgina Norfolk M.D.   On: 10/13/2020 22:48   US Abdomen Complete  Result Date: 10/15/2020 CLINICAL DATA:  Acute renal insufficiency, elevated liver function tests EXAM: ABDOMEN ULTRASOUND COMPLETE COMPARISON:  None FINDINGS: Gallbladder: The gallbladder is partially decompressed, but is otherwise unremarkable. No intraluminal stones or sludge is identified. Common bile duct: Diameter: 5 mm Liver: There are multiple heterogeneously hyper to hypoechoic masses identified throughout the liver, more prevalent within the left hepatic lobe, but scattered throughout both hepatic lobes. The dominant mass is seen within the left hepatic lobe measuring 7.4 x 5.8 x 6.6 cm and within the right hepatic lobe measuring 6.2 x 6.2 x 7.4 cm. The liver contour appears smooth. There is no intrahepatic biliary ductal dilation. Portal vein is patent on color Doppler  imaging with normal direction of blood flow towards the liver. IVC: No abnormality visualized. Pancreas: Visualized portion unremarkable. Spleen: Size and appearance within normal limits. Right Kidney: Length: 10.9 cm. Echogenicity within normal limits. No mass or hydronephrosis visualized. Left Kidney: Length: 13.7 cm. Echogenicity within normal limits. No mass or hydronephrosis visualized. Abdominal aorta: No aneurysm visualized. The distal aorta is obscured by overlying bowel gas. Other findings: None. IMPRESSION: Multiple hepatic masses suspicious for a metastatic disease. Alternatively, this could represent a primary malignant process such as multifocal hepatocellular carcinoma. Dedicated contrast enhanced CT examination of the abdomen and pelvis is recommended for further evaluation. Electronically Signed   By: Fidela Salisbury MD   On: 10/15/2020 04:20   DG Chest Port 1 View  Result Date: 10/15/2020 CLINICAL DATA:  Shortness of breath.  COVID. EXAM: PORTABLE CHEST 1 VIEW COMPARISON:  Prior chest radiographs 11/04/2020. FINDINGS: Tracheostomy in place. Prior median sternotomy/CABG. Unchanged cardiomegaly. Central pulmonary vascular congestion. Persistent opacities within the bilateral lung bases. No sizable pleural effusion or evidence of pneumothorax. No acute bony abnormality is identified. IMPRESSION: Persistent opacities within the lung bases suspicious for pneumonia given the provided history. However, atelectasis is also a consideration. Unchanged cardiomegaly with central pulmonary vascular congestion. Electronically Signed   By: Kellie Simmering DO   On: 10/15/2020 08:06   DG Chest Port 1 View  Result Date: 10/21/2020 CLINICAL DATA:  Sepsis? Shortness of breath EXAM: PORTABLE CHEST 1 VIEW COMPARISON:  09/25/2020 FINDINGS: Unchanged cardiomegaly. Scattered opacities at the lung bases favored to be atelectasis rather than pneumonia. 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 The tracheostomy cannula again  identified. IMPRESSION: Patchy opacities at the lung bases favored to be atelectasis rather than pneumonia. Electronically Signed   By: Miachel Roux M.D.   On: 10/28/2020 15:59   DG CHEST PORT 1 VIEW  Result Date: 09/25/2020 CLINICAL DATA:  Bloody sputum.  Tracheostomy. EXAM: PORTABLE CHEST 1 VIEW COMPARISON:  07/21/2020 FINDINGS: Tracheostomy tube is seen in expected position. Prior CABG again noted. Cardiomegaly and pulmonary vascular congestion remains stable. New opacity is seen in the medial left lower lobe, suspicious for pneumonia. Right lung is clear. No evidence of pleural effusion. IMPRESSION: New opacity in medial left lower lobe, suspicious for pneumonia. Stable cardiomegaly and pulmonary vascular congestion. Electronically Signed   By: Marlaine Hind M.D.   On: 09/25/2020 11:43

## 2020-10-15 NOTE — ED Notes (Signed)
Bladder Scan: 50ML   Failed attempt to collect labs

## 2020-10-15 NOTE — Consult Note (Signed)
Consultation Note Date: 10/15/2020   Patient Name: Paul Lowery  DOB: 1950/12/20  MRN: [id number]  Age / Sex: 70 y.o., male  PCP: Benito Mccreedy, MD Referring Physician: Thurnell Lose, MD  Reason for Consultation: Establishing goals of care  HPI/Patient Profile: 70 y.o. 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 He was brought in to the emergency department from [facility] on 10/22/20 with hypoxia, bleeding from trach, and altered mental status.  ED Course: Positive for COVID-19. Chest x-ray shows scattered opacities at the lung bases.  Creatinine 2.75, hemoglobin 7.4. Admitted to [facility] for management of COVID-19 pneumonia, AKI, anemia, and dehydration.   Clinical Assessment and Goals of Care: I have reviewed medical records including EPIC notes, labs and imaging, and examined the patient. He appears uncomfortable and restless. He follows commands but is unable to participate in a discussion without his speaking valve.   I spoke with daughter [name] by phone  to discuss diagnosis, prognosis, [place], EOL wishes, disposition, and options.  Patient is known to PMT from recent admission in December.  I reviewed Palliative Medicine as specialized medical care for people living with serious illness. It focuses on providing relief from the symptoms and stress of a serious illness.   We discussed a brief life review of the patient. He is originally from [facility]. 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 He has 3 children: Regina ([place]), Grayland Ormond ([place]), and [employer] ([place]). [name] states that she is the only one who is involved.   Prior to April 2021, patient lived  independently in his home. His health has rapidly declined since then. He has resided at [facility] since September 2021. He is bed-bound and dependent for ADLs. He is tube feeding dependent.   We discussed his current illness and what it means in the larger context of his ongoing co-morbidities.  Natural disease trajectory of advanced cancer was discussed.  During hospitalization in December, code status was changed to DNR per patient's wishes. Otherwise he wanted full scope treatment.    The difference between aggressive medical intervention and comfort care was considered in light of the patient's goals of care. [name] is not yet ready to contemplate comfort care. She specifically would not want to stop tube feeding. We discuss that stopping tube feeds is the same (ethically) as not starting tube feeds but that emotionally it feels different.   We discussed the visit on 06/16/20 with [place] oncology, where patient and family were informed that he was not a candidate for systemic therapy. During this visit, hospice was recommended by oncologist but family was not ready to discuss at this time.   Today I broached the subject of hospice again with [facility]. She seems somewhat open to the concept, but shares with me that she has seen situations where she feels hospice hastened the  end of life process. Discussed that hospice referrals are often done too late, and in those cases the it may seem that someone passes soon after going under hospice care when in reality it is their advanced disease process.    Questions and concerns were addressed.  [name] was encouraged to call with questions or concerns.   Primary decision maker: Rebecca Eaton (daughter) [phone]   SUMMARY OF RECOMMENDATIONS    DNR as previously documented  continue current medical care  patient would be eligible for hospice care; daughter will need ongoing discussion  PMT will continue to follow  Code Status/Advance Care  Planning:  DNR  Symptom Management:   Per primary team  Palliative Prophylaxis:   Aspiration, Oral Care and Turn Reposition  Additional Recommendations (Limitations, Scope, Preferences):  Full Scope Treatment  Psycho-social/Spiritual:   Created space and opportunity for family to express thoughts and feelings regarding patient's current medical situation.   Emotional support provided   Prognosis:   months  Discharge Planning: [facility], either with outpatient palliative or hospice      Primary Diagnoses: Present on Admission: . Pneumonia due to COVID-19 virus . Atrial fibrillation, chronic ([place])   I have reviewed the medical record, interviewed the patient and family, and examined the patient. The following aspects are pertinent.  Past Medical History:  Diagnosis Date  . Anemia   . Cancer ([place])   . Coronary artery disease   . Hypertension   . Tracheostomy dependent ([place])    History reviewed. No pertinent family history. Scheduled Meds: . atorvastatin  80 mg Per Tube Daily  . carvedilol  6.25 mg Per Tube BID WC  . haloperidol  5 mg Oral Daily  . methylPREDNISolone (SOLU-MEDROL) injection  60 mg Intravenous Q12H  . pantoprazole (PROTONIX) IV  40 mg Intravenous Q24H  . senna-docusate  1 tablet Per Tube Daily   Continuous Infusions: . sodium chloride 100 mL/hr at 10/15/20 0755  . [START ON 10/16/2020] remdesivir 100 mg in NS 100 mL     PRN Meds:.acetaminophen, haloperidol lactate, LORazepam, morphine injection, polyethylene glycol Medications Prior to Admission:  Prior to Admission medications   Medication Sig Start Date End Date Taking? Authorizing Provider  acetaminophen (TYLENOL) 325 MG tablet Place 975 mg into feeding tube every 8 (eight) hours.   Yes [provider]  atorvastatin (LIPITOR) 80 MG tablet Place 80 mg into feeding tube daily.   Yes [provider]  budesonide (PULMICORT) 0.5 MG/2ML nebulizer solution Take 0.5 mg by  nebulization every 12 (twelve) hours.   Yes [provider]  carvedilol (COREG) 25 MG tablet Place 25 mg into feeding tube in the morning and at bedtime.   Yes [provider]  chlorhexidine (PERIDEX) 0.12 % solution Use as directed 5 mLs in the mouth or throat every 12 (twelve) hours.   Yes [provider]  fluticasone (FLONASE) 50 MCG/ACT nasal spray Place 1 spray into both nostrils every 12 (twelve) hours as needed for allergies or rhinitis.   Yes [provider]  haloperidol (HALDOL) 5 MG tablet Take 5 mg by mouth daily.   Yes [provider]  HYDROmorphone (DILAUDID) 4 MG tablet Place 4 mg into feeding tube every 6 (six) hours as needed for severe pain.   Yes [provider]  ipratropium-albuterol (DUONEB) 0.5-2.5 (3) MG/3ML SOLN Take 3 mLs by nebulization every 6 (six) hours.   Yes [provider]  lisinopril (ZESTRIL) 5 MG tablet Take 5 mg by mouth daily.  Yes [provider]  loratadine (CLARITIN) 10 MG tablet Place 10 mg into feeding tube daily.   Yes [provider]  LORazepam (ATIVAN) 1 MG tablet Place 1 mg into feeding tube every 6 (six) hours as needed for anxiety.   Yes [provider]  melatonin 3 MG TABS tablet Place 3 mg into feeding tube at bedtime.   Yes [provider]  Nutritional Supplements (FEEDING SUPPLEMENT, JEVITY 1.5 CAL,) LIQD Place 250 mLs into feeding tube every 4 (four) hours.   Yes [provider]  oxyCODONE (OXY IR/ROXICODONE) 5 MG immediate release tablet Take 1 pills every 6 hrs as needed for pain Patient taking differently: Place 5 mg into feeding tube every 6 (six) hours. 06/28/20  Yes McBane, Maylene Roes, PA-C  Racepinephrine HCl (S2, RACEPINEPHRINE,) 2.25 % NEBU nebulizer solution Take 0.5 mLs by nebulization every 6 (six) hours as needed (shortness of breath, wheezing). Give with 22mL iced saline   Yes [provider]  senna-docusate (SENOKOT-S)  8.6-50 MG tablet Place 1 tablet into feeding tube daily.   Yes [provider]  furosemide (LASIX) 80 MG tablet Place 0.5 tablets (40 mg total) into feeding tube daily. Patient not taking: No sig reported 09/28/20   Georgette Shell, MD  polyethylene glycol (MIRALAX / GLYCOLAX) 17 g packet Take 17 g by mouth daily as needed for mild constipation. Patient not taking: No sig reported 06/28/20   Vashti Hey, MD  Water For Irrigation, Sterile (FREE WATER) SOLN Place 175 mLs into feeding tube every 4 (four) hours. Patient not taking: Reported on 10/16/2020 06/28/20   Vashti Hey, MD   No Known Allergies Review of Systems  Unable to perform ROS: Patient nonverbal    Physical Exam Vitals reviewed.  Constitutional:      General: He is not in acute distress.    Appearance: He is ill-appearing.  Cardiovascular:     Rate and Rhythm: Tachycardia present.  Pulmonary:     Comments: Tracheostomy, trach collar with FiO2 28% Abdominal:     Comments: G-tube  Neurological:     Mental Status: He is lethargic.     Motor: Weakness present.     Comments: Follows commands  Psychiatric:     Comments: Restless, mildly agitated     Vital Signs: BP 122/62   Pulse 94   Temp 97.6 F (36.4 C) (Axillary)   Resp (!) 23   Ht 6\' 3"  (1.905 m)   Wt 81.6 kg   SpO2 97%   BMI 22.50 kg/m  Pain Scale: 0-10   Pain Score: 2    SpO2: SpO2: 97 % O2 Device:SpO2: 97 % O2 Flow Rate: .O2 Flow Rate (L/min): 5 L/min  IO: Intake/output summary:   Intake/Output Summary (Last 24 hours) at 10/15/2020 1809 Last data filed at 10/15/2020 1603 Gross per 24 hour  Intake -  Output 150 ml  Net -150 ml    LBM:   Baseline Weight: Weight: 81.6 kg Most recent weight: Weight: 81.6 kg      Palliative Assessment/Data: PPS 30% (with tube feeds)     Time In: 17:20 Time Out: 18:35 Time Total: 75 minutes Greater than 50%  of this time was spent counseling and coordinating care  related to the above assessment and plan.  Signed by: Lavena Bullion, NP   Please contact Palliative Medicine Team phone at [phone] for questions and concerns.  For individual provider: See Shea Evans

## 2020-10-15 NOTE — ED Notes (Signed)
Attempted report, RN is busy in report

## 2020-10-15 NOTE — ED Notes (Signed)
Ultrasound in progress at bedside.

## 2020-10-16 ENCOUNTER — Inpatient Hospital Stay (HOSPITAL_COMMUNITY): Payer: Medicare Other

## 2020-10-16 DIAGNOSIS — Z515 Encounter for palliative care: Secondary | ICD-10-CM | POA: Diagnosis not present

## 2020-10-16 DIAGNOSIS — N179 Acute kidney failure, unspecified: Secondary | ICD-10-CM | POA: Diagnosis not present

## 2020-10-16 DIAGNOSIS — J1282 Pneumonia due to coronavirus disease 2019: Secondary | ICD-10-CM | POA: Diagnosis not present

## 2020-10-16 DIAGNOSIS — U071 COVID-19: Secondary | ICD-10-CM | POA: Diagnosis not present

## 2020-10-16 DIAGNOSIS — G9341 Metabolic encephalopathy: Secondary | ICD-10-CM | POA: Diagnosis not present

## 2020-10-16 LAB — CBC WITH DIFFERENTIAL/PLATELET
Abs Immature Granulocytes: 0.04 10*3/uL (ref 0.00–0.07)
Basophils Absolute: 0 10*3/uL (ref 0.0–0.1)
Basophils Relative: 0 %
Eosinophils Absolute: 0 10*3/uL (ref 0.0–0.5)
Eosinophils Relative: 0 %
HCT: 22.4 % — ABNORMAL LOW (ref 39.0–52.0)
Hemoglobin: 7.4 g/dL — ABNORMAL LOW (ref 13.0–17.0)
Immature Granulocytes: 1 %
Lymphocytes Relative: 5 %
Lymphs Abs: 0.3 10*3/uL — ABNORMAL LOW (ref 0.7–4.0)
MCH: 27 pg (ref 26.0–34.0)
MCHC: 33 g/dL (ref 30.0–36.0)
MCV: 81.8 fL (ref 80.0–100.0)
Monocytes Absolute: 0.5 10*3/uL (ref 0.1–1.0)
Monocytes Relative: 9 %
Neutro Abs: 4.6 10*3/uL (ref 1.7–7.7)
Neutrophils Relative %: 85 %
Platelets: 257 10*3/uL (ref 150–400)
RBC: 2.74 MIL/uL — ABNORMAL LOW (ref 4.22–5.81)
RDW: 19.8 % — ABNORMAL HIGH (ref 11.5–15.5)
WBC: 5.4 10*3/uL (ref 4.0–10.5)
nRBC: 0 % (ref 0.0–0.2)

## 2020-10-16 LAB — C-REACTIVE PROTEIN: CRP: 28.1 mg/dL — ABNORMAL HIGH (ref <1.0)

## 2020-10-16 LAB — COMPREHENSIVE METABOLIC PANEL
ALT: 22 U/L (ref 0–44)
AST: 89 U/L — ABNORMAL HIGH (ref 15–41)
Albumin: 1.9 g/dL — ABNORMAL LOW (ref 3.5–5.0)
Alkaline Phosphatase: 369 U/L — ABNORMAL HIGH (ref 38–126)
Anion gap: 15 (ref 5–15)
BUN: 73 mg/dL — ABNORMAL HIGH (ref 8–23)
CO2: 20 mmol/L — ABNORMAL LOW (ref 22–32)
Calcium: 7.8 mg/dL — ABNORMAL LOW (ref 8.9–10.3)
Chloride: 94 mmol/L — ABNORMAL LOW (ref 98–111)
Creatinine, Ser: 3.67 mg/dL — ABNORMAL HIGH (ref 0.61–1.24)
GFR, Estimated: 17 mL/min — ABNORMAL LOW (ref >60)
Glucose, Bld: 122 mg/dL — ABNORMAL HIGH (ref 70–99)
Potassium: 5.8 mmol/L — ABNORMAL HIGH (ref 3.5–5.1)
Sodium: 129 mmol/L — ABNORMAL LOW (ref 135–145)
Total Bilirubin: 1.9 mg/dL — ABNORMAL HIGH (ref 0.3–1.2)
Total Protein: 7.5 g/dL (ref 6.5–8.1)

## 2020-10-16 LAB — MAGNESIUM: Magnesium: 2.5 mg/dL — ABNORMAL HIGH (ref 1.7–2.4)

## 2020-10-16 LAB — PROCALCITONIN: Procalcitonin: 0.72 ng/mL

## 2020-10-16 LAB — D-DIMER, QUANTITATIVE: D-Dimer, Quant: 13.26 ug/mL-FEU — ABNORMAL HIGH (ref 0.00–0.50)

## 2020-10-16 LAB — GLUCOSE, CAPILLARY: Glucose-Capillary: 189 mg/dL — ABNORMAL HIGH (ref 70–99)

## 2020-10-16 LAB — BRAIN NATRIURETIC PEPTIDE: B Natriuretic Peptide: 307.7 pg/mL — ABNORMAL HIGH (ref 0.0–100.0)

## 2020-10-16 LAB — LACTIC ACID, PLASMA: Lactic Acid, Venous: 1.2 mmol/L (ref 0.5–1.9)

## 2020-10-16 LAB — MRSA PCR SCREENING: MRSA by PCR: NEGATIVE

## 2020-10-16 MED ORDER — HYDROMORPHONE HCL 1 MG/ML PO LIQD: 1.0000 mg | ORAL | Status: DC | PRN

## 2020-10-16 MED ORDER — JEVITY 1.5 CAL/FIBER PO LIQD
237.0000 mL | Freq: Every day | ORAL | Status: DC
  Filled 2020-10-16 (×4): qty 237

## 2020-10-16 MED ORDER — HYDROMORPHONE HCL 1 MG/ML IJ SOLN
1.0000 mg | INTRAMUSCULAR | Status: DC | PRN
  Administered 2020-10-16: 1 mg via INTRAVENOUS
  Filled 2020-10-16 (×2): qty 1

## 2020-10-16 MED ORDER — SODIUM POLYSTYRENE SULFONATE 15 GM/60ML PO SUSP
30.0000 g | Freq: Once | ORAL | Status: AC
  Administered 2020-10-16: 30 g
  Filled 2020-10-16: qty 120

## 2020-10-16 MED ORDER — SODIUM CHLORIDE 0.9 % IV SOLN: INTRAVENOUS | Status: DC

## 2020-10-16 MED ORDER — HYDROMORPHONE HCL 1 MG/ML IJ SOLN
2.0000 mg | Freq: Once | INTRAMUSCULAR | Status: AC
  Administered 2020-10-16: 2 mg via INTRAVENOUS

## 2020-10-16 MED ORDER — PROSOURCE TF PO LIQD
90.0000 mL | Freq: Three times a day (TID) | ORAL | Status: DC
  Administered 2020-10-16 (×2): 90 mL
  Filled 2020-10-16 (×3): qty 90

## 2020-10-16 MED ORDER — MORPHINE SULFATE (PF) 4 MG/ML IV SOLN
4.0000 mg | Freq: Once | INTRAVENOUS | Status: AC
  Administered 2020-10-16: 4 mg via INTRAVENOUS
  Filled 2020-10-16: qty 1

## 2020-10-16 MED ORDER — JEVITY 1.2 CAL PO LIQD
237.0000 mL | Freq: Every day | ORAL | Status: DC
  Administered 2020-10-16 (×3): 237 mL
  Filled 2020-10-16 (×8): qty 237

## 2020-10-16 MED ORDER — METHYLPREDNISOLONE SODIUM SUCC 40 MG IJ SOLR
40.0000 mg | Freq: Every day | INTRAMUSCULAR | Status: DC
  Administered 2020-10-16: 40 mg via INTRAVENOUS
  Filled 2020-10-16: qty 1

## 2020-10-16 MED ORDER — HYDROMORPHONE HCL 1 MG/ML IJ SOLN
2.0000 mg | INTRAMUSCULAR | Status: DC | PRN
  Filled 2020-10-16: qty 2

## 2020-10-16 MED ORDER — SODIUM ZIRCONIUM CYCLOSILICATE 10 G PO PACK
10.0000 g | PACK | Freq: Two times a day (BID) | ORAL | Status: AC
  Administered 2020-10-16 (×2): 10 g
  Filled 2020-10-16 (×2): qty 1

## 2020-10-16 MED ORDER — PROSOURCE TF PO LIQD
45.0000 mL | Freq: Three times a day (TID) | ORAL | Status: DC
  Administered 2020-10-16: 45 mL
  Filled 2020-10-16 (×3): qty 45

## 2020-10-16 MED ORDER — SODIUM CHLORIDE 0.9 % IV SOLN
100.0000 mg | Freq: Every day | INTRAVENOUS | Status: DC
  Administered 2020-10-16: 100 mg via INTRAVENOUS
  Filled 2020-10-16: qty 20

## 2020-10-16 MED ORDER — OLANZAPINE 10 MG IM SOLR
2.5000 mg | Freq: Once | INTRAMUSCULAR | Status: AC
  Administered 2020-10-16: 2.5 mg via INTRAMUSCULAR
  Filled 2020-10-16: qty 10

## 2020-10-16 NOTE — Plan of Care (Signed)
Patient is currently resting in bed. VSS. Remains on 5L. Trach suctioned PRN, about every two hours. Provider made aware about patient low temp, blood coming out of  trach and lack of Peg tube orders. Call bell within reach. Bed alarm on.   Problem: Safety: Goal: Non-violent Restraint(s) 10/16/2020 0618 by Karis Juba, RN Outcome: Progressing   Problem: Education: Goal: Knowledge of risk factors and measures for prevention of condition will improve Outcome: Progressing   Problem: Coping: Goal: Psychosocial and spiritual needs will be supported Outcome: Progressing   Problem: Respiratory: Goal: Will maintain a patent airway Outcome: Progressing Goal: Complications related to the disease process, condition or treatment will be avoided or minimized Outcome: Progressing

## 2020-10-16 NOTE — Progress Notes (Addendum)
Initial Nutrition Assessment  INTERVENTION:   Resume home TF regimen: -Jevity 1.5, 237 ml six times daily via PEG -Free water flushes of 60 ml before and after each bolus -45 ml Prosource TF TID -Provides 2250 kcals, 123g protein and 1800 ml H2O  **Addendum:  -Notified by Pharmacy, no cartons of Jevity 1.5 available. Will switch to Jevity 1.2 , 237 ml 6x daily via PEG.  -Will increase Prosource TF to 90 ml TID. -This will provide 1950 kcals, 145g protein and 1866 ml H2O   NUTRITION DIAGNOSIS:   Increased nutrient needs related to acute illness,chronic illness,cancer and cancer related treatments (COVID-19 infection) as evidenced by estimated needs.  GOAL:   Patient will meet greater than or equal to 90% of their needs  MONITOR:   TF tolerance,I & O's,Labs,Weight trends  REASON FOR ASSESSMENT:   Consult Enteral/tube feeding initiation and management  ASSESSMENT:   70 y.o. male with medical history significant of CAD, hypertension, hyperlipidemia, advanced supraglottic squamous cell cancer followed at [facility], chronic hypercapnic respiratory failure and COPD with chronic trach with chronic bleeding/oozing from trach site follows at [facility], G-tube, CAD status post CABG, A. fib off Eliquis for the past year, chronic diastolic heart failure, hip fracture status post repair in September 2021, who is DNR and has a MOST form at baseline was brought in from [facility] for hypoxia, blood oozing from tracheostomy, being slightly more confused than baseline.  Here he was found positive for COVID-19 pneumonia with mild hypoxia, bleeding from the trach site acute on chronic, anemia, dehydration with AKI.  Patient has been COVID-19+ since admission 1/6.  Patient from [facility], with trach and PEG. Pt is unable to provide history at this time.  PTA, pt received Jevity 1.5, 237 ml 6x daily via PEG at facility. Did not see where pt receives protein modular in medication history. Will add for this  admission and order TF regimen.  Per Palliative care, pt's family wishes for pt to remain full code. Pt with poor prognosis. If declines, recommendation is for comfort measures.  Per weight records, pt has lost 53 lbs since 06/25/20 (22% wt loss x 3.5 months, significant for time frame). Suspect malnutrition continues.  Medications: Senokot, Lokelma  Labs reviewed: Low Na Elevated K (5.8), Mg (2.5)  NUTRITION - FOCUSED PHYSICAL EXAM:  Unable to complete, will attempt at follow-up  Diet Order:   Diet Order            Diet NPO time specified  Diet effective now                 EDUCATION NEEDS:   No education needs have been identified at this time  Skin:  Skin Assessment: Reviewed RN Assessment  Last BM:  1/7 -type 6  Height:   Ht Readings from Last 1 Encounters:  10/15/20 6\' 3"  (1.905 m)    Weight:   Wt Readings from Last 1 Encounters:  10/15/20 81.6 kg   BMI:  Body mass index is 22.5 kg/m.  Estimated Nutritional Needs:   Kcal:  2300-2500  Protein:  120-135g  Fluid:  2.3L/day  Clayton Bibles, MS, RD, LDN Inpatient Clinical Dietitian Contact information available via Amion

## 2020-10-16 NOTE — Progress Notes (Signed)
PROGRESS NOTE                                                                                                                                                                                                             Patient Demographics:    Paul Lowery, is a 70 y.o. male, DOB - [birth year], WER:[id number]  Outpatient Primary MD for the patient is Osei-Bonsu, Iona Beard, MD    LOS - 2  Admit date - November 06, 2020    Chief Complaint  Patient presents with  . Altered Mental Status       Brief Narrative (HPI from H&P) Paul Lowery is a 70 y.o. male with medical history significant of CAD, hypertension, hyperlipidemia, advanced supraglottic squamous cell cancer followed at [facility], chronic hypercapnic respiratory failure and COPD with chronic trach with chronic bleeding/oozing from trach site follows at [facility], G-tube, CAD status post CABG, A. fib off Eliquis for the past year, chronic diastolic heart failure, hip fracture status post repair in September 2021, who is DNR and has a MOST form at baseline was brought in from [facility] for hypoxia, blood oozing from tracheostomy, being slightly more confused than baseline.  Here he was found positive for COVID-19 pneumonia with mild hypoxia, bleeding from the trach site acute on chronic, anemia, dehydration with AKI.   Subjective:   Patient in bed, appears to be in no acute distress but somewhat restless, pointing towards his throat, nods yes that he is uncomfortable.  Unable to reliably answer all questions and follow commands.   Assessment  & Plan :     1. Acute on chronic hypoxic Resp. Failure due to Acute Covid 19 Viral Pneumonitis  - his mineralization status is unknown, appears to have mild to moderate parenchymal lung injury, already has a trach and being oxygenated via the trach collar.  Neuroid's and Remdesivir.  Poor candidate for Actemra Baricitinib due to multiple severe  comorbidities and poor prognostic status at baseline.  Encouraged the patient to sit up in chair in the daytime use I-S and flutter valve for pulmonary toiletry and then prone in bed when at night.  Will advance activity and titrate down oxygen as possible.    SpO2: 98 % O2 Flow Rate (L/min): 5 L/min FiO2 (%): 28 %  Recent Labs  Lab 06-Nov-2020 1718 11-06-2020 1747 2020-11-06 1748 2020-11-06 1755  10/20/2020 1805 10/15/20 1058 10/16/20 0524  WBC 7.5  --   --   --   --   --  5.4  HGB 7.4* 8.8* 8.5*  --   --   --  7.4*  HCT 23.9* 26.0* 25.0*  --   --   --  22.4*  PLT 269  --   --   --   --   --  257  CRP  --   --   --   --   --  29.0* 28.1*  BNP  --   --   --  151.5*  --  186.1* 307.7*  DDIMER  --   --   --   --   --  19.27* 13.26*  PROCALCITON  --   --   --   --   --  0.54 0.72  AST 92*  --   --   --   --  92* 89*  ALT 21  --   --   --   --  21 22  ALKPHOS 364*  --   --   --   --  390* 369*  BILITOT 1.8*  --   --   --   --  2.0* 1.9*  ALBUMIN 2.0*  --   --   --   --  2.1* 1.9*  INR 1.3*  --   --   --   --   --   --   LATICACIDVEN  --   --   --   --  2.0*  --  1.2  SARSCOV2NAA POSITIVE*  --   --   --   --   --   --     2.  Severe dehydration with hyponatremia and AKI.  Hydrate with IV fluids.  AKI worse due to underlying anemia as well, will obtain renal ultrasound for any obstruction which I doubt, will not further transfuse as I do not think it is going to serve any long-term purpose.  Continue gentle medical treatment if declines full comfort care.  Discussed with daughter on 10/16/2020.  3.  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  DNR with baseline MOST form.  Extremely poor prognosis discussed with daughter, supportive care, if any significant decline full comfort measures.  4.  Anemia.  Due to acute on chronic intermittent bleeding from the trach site.  No further transfusion because this bleeding is chronic and he would be  anemic in no time again, this is just prolonging his discomfort, will focus on gentle medical treatment directed towards comfort.  5.  Hypertension and dyslipidemia.  Coreg and statin through the PEG tube.   6 . Acute on chronic metabolic encephalopathy.  Haldol and Ativan. C T head nonacute.  7.  Paroxysmal A. fib.  Beta-blocker.  Goal rate control.  Not anticoagulation candidate due to constant trach site bleeding.        Condition - Extremely Guarded  Family Communication  :   Daughter [phone] - 10/15/20  Code Status :  DNR  Consults  : Palliative care  Procedures  :    Right upper quadrant ultrasound.  Liver Mets  CT head. Non acute  PUD Prophylaxis : PPI  Disposition Plan  :    Status is: Inpatient  Remains inpatient appropriate because:IV treatments appropriate due to intensity of illness or inability to take PO  Dispo: The patient is from: SNF  Anticipated d/c is to: SNF              Anticipated d/c date is: 2 days              Patient currently is not medically stable to d/c.   DVT Prophylaxis  :   SCDs    Lab Results  Component Value Date   PLT 257 10/16/2020    Diet :  Diet Order            Diet NPO time specified  Diet effective now                  Inpatient Medications  Scheduled Meds: . atorvastatin  80 mg Per Tube Daily  . carvedilol  6.25 mg Per Tube BID WC  . haloperidol  5 mg Oral Daily  . methylPREDNISolone (SOLU-MEDROL) injection  40 mg Intravenous Daily  . pantoprazole (PROTONIX) IV  40 mg Intravenous Q24H  . senna-docusate  1 tablet Per Tube Daily  . sodium polystyrene  30 g Per Tube Once  . sodium zirconium cyclosilicate  10 g Per Tube BID   Continuous Infusions: . sodium chloride    . remdesivir 100 mg in NS 100 mL     PRN Meds:.acetaminophen, haloperidol lactate, HYDROmorphone HCl, LORazepam, polyethylene glycol  Antibiotics  :    Anti-infectives (From admission, onward)   Start     Dose/Rate Route  Frequency Ordered Stop   10/16/20 1000  remdesivir 100 mg in sodium chloride 0.9 % 100 mL IVPB       "Followed by" Linked Group Details   100 mg 200 mL/hr over 30 Minutes Intravenous Daily 2020/10/24 2355 10/20/20 0959   10/15/20 0030  remdesivir 200 mg in sodium chloride 0.9% 250 mL IVPB       "Followed by" Linked Group Details   200 mg 580 mL/hr over 30 Minutes Intravenous Once 24-Oct-2020 2355 10/15/20 0520   October 24, 2020 1830  vancomycin (VANCOREADY) IVPB 1750 mg/350 mL        1,750 mg 175 mL/hr over 120 Minutes Intravenous  Once 2020/10/24 1809 10/15/20 0114   24-Oct-2020 1819  vancomycin variable dose per unstable renal function (pharmacist dosing)  Status:  Discontinued         Does not apply See admin instructions 10-24-2020 1820 10/15/20 0300   Oct 24, 2020 1815  ceFEPIme (MAXIPIME) 2 g in sodium chloride 0.9 % 100 mL IVPB        2 g 200 mL/hr over 30 Minutes Intravenous  Once 10/24/2020 1802 10/15/20 0114   24-Oct-2020 1800  ceFEPIme (MAXIPIME) 1 g in sodium chloride 0.9 % 100 mL IVPB  Status:  Discontinued        1 g 200 mL/hr over 30 Minutes Intravenous  Once October 24, 2020 1755 10-24-20 1802       Time Spent in minutes  30   Lala Lund M.D on 10/16/2020 at 9:12 AM  To page go to [email or web address]   [facility] -  Office  [phone]  See all Orders from today for further details    Objective:   Vitals:   10/16/20 0404 10/16/20 0436 10/16/20 0545 10/16/20 0750  BP:  128/84  119/68  Pulse: 89 88  81  Resp: 20 19  (!) 24  Temp:  97.6 F (36.4 C) 98.4 F (36.9 C) 98.2 F (36.8 C)  TempSrc:  Axillary Axillary Oral  SpO2: 96% 99%  98%  Weight:      Height:  Wt Readings from Last 3 Encounters:  10/15/20 81.6 kg  09/25/20 97 kg  06/25/20 106.1 kg     Intake/Output Summary (Last 24 hours) at 10/16/2020 0912 Last data filed at 10/16/2020 0300 Gross per 24 hour  Intake 0 ml  Output 150 ml  Net -150 ml     Physical Exam  Awake but somewhat confused elderly  African-American male in no distress, .AT,PERRAL Supple Neck, trach site in place with some bloody discharge Symmetrical Chest wall movement, Good air movement bilaterally, CTAB RRR,No Gallops, Rubs or new Murmurs, No Parasternal Heave +ve B.Sounds, Abd Soft, No tenderness, No organomegaly appriciated, No rebound - guarding or rigidity. No Cyanosis, Clubbing or edema, No new Rash or bruise      Data Review:    CBC Recent Labs  Lab 11/02/2020 1718 10/09/2020 1747 11/02/2020 1748 10/16/20 0524  WBC 7.5  --   --  5.4  HGB 7.4* 8.8* 8.5* 7.4*  HCT 23.9* 26.0* 25.0* 22.4*  PLT 269  --   --  257  MCV 83.0  --   --  81.8  MCH 25.7*  --   --  27.0  MCHC 31.0  --   --  33.0  RDW 19.5*  --   --  19.8*  LYMPHSABS 0.6*  --   --  0.3*  MONOABS 1.3*  --   --  0.5  EOSABS 0.0  --   --  0.0  BASOSABS 0.0  --   --  0.0    Recent Labs  Lab 10/13/2020 1718 10/25/2020 1747 11/07/2020 1748 10/10/2020 1755 10/31/2020 1805 10/15/20 1058 10/16/20 0524  NA 128* 131* 131*  --   --  129* 129*  K 4.9 5.0 5.0  --   --  5.5* 5.8*  CL 95*  --  94*  --   --  95* 94*  CO2 24  --   --   --   --  20* 20*  GLUCOSE 95  --  95  --   --  92 122*  BUN 53*  --  50*  --   --  58* 73*  CREATININE 2.75*  --  2.80*  --   --  3.19* 3.67*  CALCIUM 8.6*  --   --   --   --  8.5* 7.8*  AST 92*  --   --   --   --  92* 89*  ALT 21  --   --   --   --  21 22  ALKPHOS 364*  --   --   --   --  390* 369*  BILITOT 1.8*  --   --   --   --  2.0* 1.9*  ALBUMIN 2.0*  --   --   --   --  2.1* 1.9*  MG  --   --   --   --   --  2.3 2.5*  CRP  --   --   --   --   --  29.0* 28.1*  DDIMER  --   --   --   --   --  19.27* 13.26*  PROCALCITON  --   --   --   --   --  0.54 0.72  LATICACIDVEN  --   --   --   --  2.0*  --  1.2  INR 1.3*  --   --   --   --   --   --  BNP  --   --   --  151.5*  --  186.1* 307.7*    ------------------------------------------------------------------------------------------------------------------ No results  for input(s): CHOL, HDL, LDLCALC, TRIG, CHOLHDL, LDLDIRECT in the last 72 hours.  No results found for: HGBA1C ------------------------------------------------------------------------------------------------------------------ No results for input(s): TSH, T4TOTAL, T3FREE, THYROIDAB in the last 72 hours.  Invalid input(s): FREET3  Cardiac Enzymes No results for input(s): CKMB, TROPONINI, MYOGLOBIN in the last 168 hours.  Invalid input(s): CK ------------------------------------------------------------------------------------------------------------------    Component Value Date/Time   BNP 307.7 (H) 10/16/2020 0524    Micro Results Recent Results (from the past 240 hour(s))  Resp Panel by RT-PCR (Flu A&B, Covid) Nasopharyngeal Swab     Status: Abnormal   Collection Time: 11/08/2020  5:18 PM   Specimen: Nasopharyngeal Swab; Nasopharyngeal(NP) swabs in vial transport medium  Result Value Ref Range Status   SARS Coronavirus 2 by RT PCR POSITIVE (A) NEGATIVE Final    Comment: RESULT CALLED TO, READ BACK BY AND VERIFIED WITH: A COSTALES RN 1859 10/16/2020 A BROWNING (NOTE) SARS-CoV-2 target nucleic acids are DETECTED.  The SARS-CoV-2 RNA is generally detectable in upper respiratory specimens during the acute phase of infection. Positive results are indicative of the presence of the identified virus, but do not rule out bacterial infection or co-infection with other pathogens not detected by the test. Clinical correlation with patient history and other diagnostic information is necessary to determine patient infection status. The expected result is Negative.  Fact Sheet for Patients: EntrepreneurPulse.com.au  Fact Sheet for Healthcare Providers: IncredibleEmployment.be  This test is not yet approved or cleared by the [place] FDA and  has been authorized for detection and/or diagnosis of SARS-CoV-2 by FDA under an Emergency Use Authorization  (EUA).  This EUA will remain in effect (meaning this test can  be used) for the duration of  the COVID-19 declaration under Section 564(b)(1) of the Act, 21 U.S.C. section 360bbb-3(b)(1), unless the authorization is terminated or revoked sooner.     Influenza A by PCR NEGATIVE NEGATIVE Final   Influenza B by PCR NEGATIVE NEGATIVE Final    Comment: (NOTE) The Xpert Xpress SARS-CoV-2/FLU/RSV plus assay is intended as an aid in the diagnosis of influenza from Nasopharyngeal swab specimens and should not be used as a sole basis for treatment. Nasal washings and aspirates are unacceptable for Xpert Xpress SARS-CoV-2/FLU/RSV testing.  Fact Sheet for Patients: EntrepreneurPulse.com.au  Fact Sheet for Healthcare Providers: IncredibleEmployment.be  This test is not yet approved or cleared by the [place] FDA and has been authorized for detection and/or diagnosis of SARS-CoV-2 by FDA under an Emergency Use Authorization (EUA). This EUA will remain in effect (meaning this test can be used) for the duration of the COVID-19 declaration under Section 564(b)(1) of the Act, 21 U.S.C. section 360bbb-3(b)(1), unless the authorization is terminated or revoked.  Performed at [facility], [place] [address], [place], [place] [address]   MRSA PCR Screening     Status: None   Collection Time: 10/15/20  8:56 PM  Result Value Ref Range Status   MRSA by PCR NEGATIVE NEGATIVE Final    Comment:        The GeneXpert MRSA Assay (FDA approved for NASAL specimens only), is one component of a comprehensive MRSA colonization surveillance program. It is not intended to diagnose MRSA infection nor to guide or monitor treatment for MRSA infections. Performed at [facility], [place] [address] [place], [place] [address]     Radiology Reports  CT Head Wo Contrast  Result Date: 10/10/2020 CLINICAL DATA:  Altered mental status. EXAM: CT HEAD WITHOUT CONTRAST  TECHNIQUE: Contiguous axial images were obtained from the base of the skull through the vertex without intravenous contrast. COMPARISON:  None. FINDINGS: Brain: There is mild cerebral atrophy with widening of the extra-axial spaces and ventricular dilatation. There are areas of decreased attenuation within the white matter tracts of the supratentorial brain, consistent with microvascular disease changes. CSF attenuation is seen within the region of the sella. Vascular: No hyperdense vessel or unexpected calcification. Skull: Normal. Negative for fracture or focal lesion. Sinuses/Orbits: A 7 mm x 9 mm right-sided frontal sinus polyp versus mucous retention cyst. Other: A 9 mm x 5 mm benign-appearing, well-defined calcified soft tissue nodule is seen within the posterior parietal scalp on the left. IMPRESSION: 1. Generalized cerebral atrophy, without evidence of an acute intracranial abnormality. 2. CSF attenuation within the region of the sella which may represent an empty sella. MRI correlation is recommended. Electronically Signed   By: Virgina Norfolk M.D.   On: 11/03/2020 22:48   US Abdomen Complete  Result Date: 10/15/2020 CLINICAL DATA:  Acute renal insufficiency, elevated liver function tests EXAM: ABDOMEN ULTRASOUND COMPLETE COMPARISON:  None FINDINGS: Gallbladder: The gallbladder is partially decompressed, but is otherwise unremarkable. No intraluminal stones or sludge is identified. Common bile duct: Diameter: 5 mm Liver: There are multiple heterogeneously hyper to hypoechoic masses identified throughout the liver, more prevalent within the left hepatic lobe, but scattered throughout both hepatic lobes. The dominant mass is seen within the left hepatic lobe measuring 7.4 x 5.8 x 6.6 cm and within the right hepatic lobe measuring 6.2 x 6.2 x 7.4 cm. The liver contour appears smooth. There is no intrahepatic biliary ductal dilation. Portal vein is patent on color Doppler imaging with normal direction of  blood flow towards the liver. IVC: No abnormality visualized. Pancreas: Visualized portion unremarkable. Spleen: Size and appearance within normal limits. Right Kidney: Length: 10.9 cm. Echogenicity within normal limits. No mass or hydronephrosis visualized. Left Kidney: Length: 13.7 cm. Echogenicity within normal limits. No mass or hydronephrosis visualized. Abdominal aorta: No aneurysm visualized. The distal aorta is obscured by overlying bowel gas. Other findings: None. IMPRESSION: Multiple hepatic masses suspicious for a metastatic disease. Alternatively, this could represent a primary malignant process such as multifocal hepatocellular carcinoma. Dedicated contrast enhanced CT examination of the abdomen and pelvis is recommended for further evaluation. Electronically Signed   By: Fidela Salisbury MD   On: 10/15/2020 04:20   DG Chest Port 1 View  Result Date: 10/16/2020 CLINICAL DATA:  Shortness of breath EXAM: PORTABLE CHEST 1 VIEW COMPARISON:  Chest x-rays dated 10/15/2020 07/24/2021. FINDINGS: Stable marked cardiomegaly. Tracheostomy tube appears appropriately positioned in the midline. Median sternotomy wires appear intact and stable in alignment. Increasingly dense opacity at the LEFT lung base. Subtle airspace opacity at the RIGHT lung base is unchanged. IMPRESSION: 1. Bibasilar airspace opacities, increased on the LEFT, multifocal pneumonia versus atelectasis. Cannot exclude small bilateral pleural effusions. 2. Stable marked cardiomegaly. Stable central pulmonary vascular congestion, likely chronic. Electronically Signed   By: Franki Cabot M.D.   On: 10/16/2020 07:59   DG Chest Port 1 View  Result Date: 10/15/2020 CLINICAL DATA:  Shortness of breath.  COVID. EXAM: PORTABLE CHEST 1 VIEW COMPARISON:  Prior chest radiographs 11/05/2020. FINDINGS: Tracheostomy in place. Prior median sternotomy/CABG. Unchanged cardiomegaly. Central pulmonary vascular congestion. Persistent opacities within the bilateral  lung bases. No sizable pleural effusion or  evidence of pneumothorax. No acute bony abnormality is identified. IMPRESSION: Persistent opacities within the lung bases suspicious for pneumonia given the provided history. However, atelectasis is also a consideration. Unchanged cardiomegaly with central pulmonary vascular congestion. Electronically Signed   By: Kellie Simmering DO   On: 10/15/2020 08:06   DG Chest Port 1 View  Result Date: 10/13/2020 CLINICAL DATA:  Sepsis? Shortness of breath EXAM: PORTABLE CHEST 1 VIEW COMPARISON:  09/25/2020 FINDINGS: Unchanged cardiomegaly. Scattered opacities at the lung bases favored to be atelectasis rather than pneumonia. 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 The tracheostomy cannula again identified. IMPRESSION: Patchy opacities at the lung bases favored to be atelectasis rather than pneumonia. Electronically Signed   By: Miachel Roux M.D.   On: 10/15/2020 15:59   DG CHEST PORT 1 VIEW  Result Date: 09/25/2020 CLINICAL DATA:  Bloody sputum.  Tracheostomy. EXAM: PORTABLE CHEST 1 VIEW COMPARISON:  07/21/2020 FINDINGS: Tracheostomy tube is seen in expected position. Prior CABG again noted. Cardiomegaly and pulmonary vascular congestion remains stable. New opacity is seen in the medial left lower lobe, suspicious for pneumonia. Right lung is clear. No evidence of pleural effusion. IMPRESSION: New opacity in medial left lower lobe, suspicious for pneumonia. Stable cardiomegaly and pulmonary vascular congestion. Electronically Signed   By: Marlaine Hind M.D.   On: 09/25/2020 11:43

## 2020-10-16 NOTE — Progress Notes (Addendum)
Daily Progress Note   Patient Name: Paul Lowery       Date: 10/16/2020 DOB: 06-Apr-1951  Age: 70 y.o. MRN#: [id number] Attending Physician: Thurnell Lose, MD Primary Care Physician: Benito Mccreedy, MD Admit Date: 11-10-2020  Reason for Follow-up: continued [place] discussions  Subjective: 12:44-- [name] called PMT team phone requesting to speak with me. I returned the call and she states she has given thought to our discussion yesterday, and wants to proceed with a hospice referral for her father. She would want [facility].   17:25--I went to [facility]W to see patient. When I asked him if he is uncomfortable, he nods his head "yes". I note copious bloody secretions from his trach. His oxygen requirements have increased to 35% (8L).  18:20--I speak with [name] again by phone. I let her know that after speaking with several colleagues as well as the hospice liaison, I have learned that hospice care cannot be provided at [facility] due to the level of care being acute care, which is not in alignment with hospice philosophy.    I reviewed the concept of a comfort path with [name]. We discussed that the focus was on comfort and dignity rather than cure/prolonging life.    I expressed concern that patient is at high risk to decompensate due to his multiple co-morbidities. Discussed possibility that he may not survive hospitalization. If he does survive the acute issues, his underlying condition remains terminal and he will continue to encounter complications.   We discussed that patient would likely be eligible for residential hospice due to his terminal medical condition and symptom management needs. Discussed that tube feeds would need to be decreased and then stopped under hospice care. [name]  seems open to this information, but requests time to process. We plan to talk again tomorrow.    Length of Stay: 2  Current Medications: Scheduled Meds:   atorvastatin  80 mg Per Tube Daily   carvedilol  6.25 mg Per Tube BID WC   feeding supplement (JEVITY 1.5 CAL/FIBER)  237 mL Per Tube 6 X Daily   feeding supplement (PROSource TF)  45 mL Per Tube TID   haloperidol  5 mg Oral Daily   methylPREDNISolone (SOLU-MEDROL) injection  40 mg Intravenous Daily   pantoprazole (PROTONIX) IV  40 mg Intravenous Q24H  senna-docusate  1 tablet Per Tube Daily   sodium zirconium cyclosilicate  10 g Per Tube BID    Continuous Infusions:  remdesivir 100 mg in NS 100 mL      PRN Meds: acetaminophen, haloperidol lactate, HYDROmorphone (DILAUDID) injection, LORazepam, polyethylene glycol  Physical Exam Vitals reviewed.  Constitutional:      General: He is not in acute distress.    Appearance: He is ill-appearing.  Cardiovascular:     Rate and Rhythm: Normal rate.  Pulmonary:     Effort: Pulmonary effort is normal.     Comments: Tracheostomy, 35% (8L) trach collar Neurological:     Mental Status: He is alert.     Motor: Weakness present.  Psychiatric:        Behavior: Behavior is agitated.             Vital Signs: BP 119/68 (BP Location: Left Arm)    Pulse 81    Temp 98.2 F (36.8 C) (Oral)    Resp (!) 24    Ht 6\' 3"  (1.905 m)    Wt 81.6 kg    SpO2 98%    BMI 22.50 kg/m  SpO2: SpO2: 98 % O2 Device: O2 Device: Tracheostomy Collar O2 Flow Rate: O2 Flow Rate (L/min): 5 L/min  Intake/output summary:   Intake/Output Summary (Last 24 hours) at 10/16/2020 1244 Last data filed at 10/16/2020 0300 Gross per 24 hour  Intake 0 ml  Output 150 ml  Net -150 ml   LBM: Last BM Date: 10/15/20 Baseline Weight: Weight: 81.6 kg Most recent weight: Weight: 81.6 kg       Palliative Assessment/Data: PPS 20-30% (witn tube feeds)     Palliative Care Assessment & Plan   HPI/Patient  Profile: 70 y.o. 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 He was brought in to the emergency department from [facility] on 11/03/20 with hypoxia, bleeding from trach, and altered mental status.  ED Course: Positive for COVID-19. Chest x-ray shows scattered opacities at the lung bases.  Creatinine 2.75, hemoglobin 7.4. Admitted to [facility] for management of COVID-19 pneumonia, AKI, anemia, and dehydration.   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   Total Time 35 minutes Prolonged Time Billed  no       Greater than 50%  of this time was spent counseling and coordinating care related to the above assessment and plan.  Lavena Bullion, NP  Please contact Palliative Medicine Team phone at [phone] for questions and concerns.

## 2020-10-17 DIAGNOSIS — U071 COVID-19: Secondary | ICD-10-CM | POA: Diagnosis not present

## 2020-10-17 DIAGNOSIS — J1282 Pneumonia due to coronavirus disease 2019: Secondary | ICD-10-CM | POA: Diagnosis not present

## 2020-10-17 LAB — CBC WITH DIFFERENTIAL/PLATELET
Abs Immature Granulocytes: 0.04 10*3/uL (ref 0.00–0.07)
Basophils Absolute: 0 10*3/uL (ref 0.0–0.1)
Basophils Relative: 0 %
Eosinophils Absolute: 0 10*3/uL (ref 0.0–0.5)
Eosinophils Relative: 0 %
HCT: 20.4 % — ABNORMAL LOW (ref 39.0–52.0)
Hemoglobin: 6.9 g/dL — CL (ref 13.0–17.0)
Immature Granulocytes: 1 %
Lymphocytes Relative: 4 %
Lymphs Abs: 0.2 10*3/uL — ABNORMAL LOW (ref 0.7–4.0)
MCH: 27.1 pg (ref 26.0–34.0)
MCHC: 33.8 g/dL (ref 30.0–36.0)
MCV: 80 fL (ref 80.0–100.0)
Monocytes Absolute: 0.6 10*3/uL (ref 0.1–1.0)
Monocytes Relative: 10 %
Neutro Abs: 5.4 10*3/uL (ref 1.7–7.7)
Neutrophils Relative %: 85 %
Platelets: 264 10*3/uL (ref 150–400)
RBC: 2.55 MIL/uL — ABNORMAL LOW (ref 4.22–5.81)
RDW: 19.8 % — ABNORMAL HIGH (ref 11.5–15.5)
WBC: 6.3 10*3/uL (ref 4.0–10.5)
nRBC: 0 % (ref 0.0–0.2)

## 2020-10-17 LAB — COMPREHENSIVE METABOLIC PANEL
ALT: 21 U/L (ref 0–44)
AST: 90 U/L — ABNORMAL HIGH (ref 15–41)
Albumin: 1.9 g/dL — ABNORMAL LOW (ref 3.5–5.0)
Alkaline Phosphatase: 365 U/L — ABNORMAL HIGH (ref 38–126)
Anion gap: 13 (ref 5–15)
BUN: 94 mg/dL — ABNORMAL HIGH (ref 8–23)
CO2: 20 mmol/L — ABNORMAL LOW (ref 22–32)
Calcium: 6.5 mg/dL — ABNORMAL LOW (ref 8.9–10.3)
Chloride: 96 mmol/L — ABNORMAL LOW (ref 98–111)
Creatinine, Ser: 4.26 mg/dL — ABNORMAL HIGH (ref 0.61–1.24)
GFR, Estimated: 14 mL/min — ABNORMAL LOW (ref >60)
Glucose, Bld: 180 mg/dL — ABNORMAL HIGH (ref 70–99)
Potassium: 5.4 mmol/L — ABNORMAL HIGH (ref 3.5–5.1)
Sodium: 129 mmol/L — ABNORMAL LOW (ref 135–145)
Total Bilirubin: 1.4 mg/dL — ABNORMAL HIGH (ref 0.3–1.2)
Total Protein: 7.2 g/dL (ref 6.5–8.1)

## 2020-10-17 LAB — MAGNESIUM: Magnesium: 2.5 mg/dL — ABNORMAL HIGH (ref 1.7–2.4)

## 2020-10-17 LAB — C-REACTIVE PROTEIN: CRP: 21.7 mg/dL — ABNORMAL HIGH (ref <1.0)

## 2020-10-17 LAB — BRAIN NATRIURETIC PEPTIDE: B Natriuretic Peptide: 200.8 pg/mL — ABNORMAL HIGH (ref 0.0–100.0)

## 2020-10-17 LAB — PROCALCITONIN: Procalcitonin: 0.77 ng/mL

## 2020-10-19 LAB — URINE CULTURE: Culture: 80000 — AB

## 2020-10-19 LAB — CULTURE, BLOOD (SINGLE)
Culture: NO GROWTH
Special Requests: ADEQUATE

## 2020-11-09 NOTE — Progress Notes (Signed)
1/8 2000: RN paged provider to discuss whether patient was comfort care or not because no orders have been placed however in report it is getting passed on that he is. Per notes, Patient is not at this moment, daughter is processing it all.   RN got [name] ordered for patient since he is Q4 BGM. Tube feedings as scheduled.   2100: Patient becoming very fidgety and agitated. Pulling at blankets and oxygen equipment. Haldol given, no relief. Patient attempted to get OOB once. Mitts were placed on patient and morphine was ordered by provider. Patient settled down a little but remained agitated. Zyprexa IM given.   Patient seems to be resting better. Will keep provider posted.

## 2020-11-09 NOTE — Discharge Summary (Addendum)
[facility] Death Note                                                                                                                                                                                               Nigil Barile, is a 70 y.o. male, DOB - 1951-09-20, [id number]  Admit date - 10/09/2020   Admitting Physician Shela Leff, MD  Outpatient Primary MD for the patient is Benito Mccreedy, MD  LOS - 3  Chief Complaint  Patient presents with  . Altered Mental Status       Notification: Benito Mccreedy, MD notified of death of 11/16/20   Date and Time of Death - November 16, 2020 at 5:43 AM  Pronounced by - RN  History of present illness:   Vladimir Tinnes is a 70 y.o. male with a history of - CAD, hypertension, hyperlipidemia, advanced supraglottic squamous cell cancer followed at [facility], chronic hypercapnic respiratory failure and COPD with chronic trach with chronic bleeding/oozing from trach site follows at [facility], G-tube, CAD status post CABG, A. fib off Eliquis for the past year, chronic diastolic heart failure, hip fracture status post repair in September 2021, who is DNR and has a MOST form at baseline was brought in from [facility] for hypoxia, blood oozing from tracheostomy.  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  He was provided supportive care, at baseline he was DNR and had a MOST form.  Palliative care was consulted and family was involved in decision-making.  It was best decided that patient would be treated with comfort directed gentle medical treatment.  He became extremely uncomfortable and was finally transition to comfort care on 10/16/2020, family had agreed for residential  hospice however he passed away comfortably in the hospital on 2020/11/16 at 5:43 AM.   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  Signature  Lala Lund M.D on 16-Nov-2020 at 8:44 AM  [facility]  Office Phone -[phone]  Total clinical and documentation time for today over 30 minutes   Last Note      Thurnell Lose, MD  Physician  Internal Medicine  Progress Notes     Signed  Date of Service:  10/16/2020 9:11 AM           Signed      Expand AllCollapse All      Show:Clear all [x] Manual[x] Template[x] Copied  Added by: [x] Thurnell Lose, MD   [] Hover for details  PROGRESS NOTE                                                                                                                                                                                                             Patient Demographics:    Daxon Greason, is a 70 y.o. male, DOB - Oct 01, 1951, [id number]  Outpatient Primary MD for the patient is Osei-Bonsu, Iona Beard, MD    LOS - 2  Admit date - 11/02/2020       Chief Complaint  Patient presents with  . Altered Mental Status       Brief Narrative (HPI from H&P) Kobey Newmanis a 70 y.o.malewith medical history significant ofCAD, hypertension, hyperlipidemia, advanced supraglottic squamous cell cancer followed at [facility], chronic hypercapnic respiratory failure and COPD with chronic trach with chronic bleeding/oozing from trach site follows at [facility], G-tube, CAD status post CABG, A. fib off Eliquis for the past year, chronic diastolic heart failure, hip fracture status post repair in September 2021, who is DNR and has a MOST form at baseline was brought in from [facility] for hypoxia, blood oozing from tracheostomy, being slightly more confused than baseline.  Here he was found positive for COVID-19 pneumonia with mild hypoxia, bleeding from  the trach site acute on chronic, anemia, dehydration with AKI.   Subjective:   Patient in bed, appears to be in no acute distress but somewhat restless, pointing towards his throat, nods yes that he is uncomfortable.  Unable to reliably answer all questions and follow commands.   Assessment  & Plan :     1. Acute on chronic hypoxic Resp. Failure due to Acute Covid 19 Viral Pneumonitis  - his mineralization status is unknown, appears to have mild to moderate parenchymal lung injury, already has a trach and being oxygenated via the trach collar.  Neuroid's and Remdesivir.  Poor candidate for Actemra Baricitinib due to multiple severe comorbidities and poor prognostic status at baseline.  Encouraged the patient to sit up in chair in the daytime use I-S and flutter valve for pulmonary toiletry and then prone in bed when at night.  Will advance activity and titrate down oxygen as possible.    SpO2: 98 % O2 Flow Rate (L/min): 5 L/min FiO2 (%): 28 %  Last Labs            Recent Labs  Lab 11/02/2020 1718 10/09/2020 1747 10/13/2020 1748 10/21/2020 1755 10/13/2020 1805 10/15/20 1058 10/16/20 0524  WBC 7.5  --   --   --   --   --  5.4  HGB 7.4* 8.8* 8.5*  --   --   --  7.4*  HCT 23.9* 26.0* 25.0*  --   --   --  22.4*  PLT 269  --   --   --   --   --  257  CRP  --   --   --   --   --  29.0* 28.1*  BNP  --   --   --  151.5*  --  186.1* 307.7*  DDIMER  --   --   --   --   --  19.27* 13.26*  PROCALCITON  --   --   --   --   --  0.54 0.72  AST 92*  --   --   --   --  92* 89*  ALT 21  --   --   --   --  21 22  ALKPHOS 364*  --   --   --   --  390* 369*  BILITOT 1.8*  --   --   --   --  2.0* 1.9*  ALBUMIN 2.0*  --   --   --   --  2.1* 1.9*  INR 1.3*  --   --   --   --   --   --   LATICACIDVEN  --   --   --   --  2.0*  --  1.2  SARSCOV2NAA POSITIVE*  --   --   --   --   --   --       2.  Severe dehydration with hyponatremia and AKI.  Hydrate with IV fluids.  AKI worse due to underlying  anemia as well, will obtain renal ultrasound for any obstruction which I doubt, will not further transfuse as I do not think it is going to serve any long-term purpose.  Continue gentle medical treatment if declines full comfort care.  Discussed with daughter on 10/16/2020.  3.  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  DNR with baseline MOST form.  Extremely poor prognosis discussed with daughter, supportive care, if any significant decline full comfort measures.  4.  Anemia.  Due to acute on chronic intermittent bleeding from the trach site.  No further transfusion because this bleeding is chronic and he would be anemic in no time again, this is just prolonging his discomfort, will focus on gentle medical treatment directed towards comfort.  5.  Hypertension and dyslipidemia.  Coreg and statin through the PEG tube.   6 . Acute on chronic metabolic encephalopathy.  Haldol and Ativan. C T head nonacute.  7.  Paroxysmal A. fib.  Beta-blocker.  Goal rate control.  Not anticoagulation candidate due to constant trach site bleeding.        Condition - Extremely Guarded  Family Communication  :   Daughter [phone] - 10/15/20  Code Status :  DNR  Consults  : Palliative care  Procedures  :    Right upper quadrant ultrasound.  Liver Mets  CT head. Non acute  PUD Prophylaxis : PPI  Disposition Plan  :    Status is: Inpatient  Remains inpatient appropriate because:IV treatments appropriate due to intensity of illness or inability to take PO  Dispo: The patient is from: SNF  Anticipated d/c is to: SNF  Anticipated d/c date is: 2 days  Patient currently is not medically stable to d/c.  DVT Prophylaxis  :   SCDs    Recent Labs       Lab Results  Component Value Date   PLT 257 10/16/2020      Diet :     Diet Order                  Diet NPO time  specified  Diet effective now                   Inpatient Medications  Scheduled Meds: . atorvastatin  80 mg Per Tube Daily  . carvedilol  6.25 mg Per Tube BID WC  . haloperidol  5 mg Oral Daily  . methylPREDNISolone (SOLU-MEDROL) injection  40 mg Intravenous Daily  . pantoprazole (PROTONIX) IV  40 mg Intravenous Q24H  . senna-docusate  1 tablet Per Tube Daily  . sodium polystyrene  30 g Per Tube Once  . sodium zirconium cyclosilicate  10 g Per Tube BID   Continuous Infusions: . sodium chloride    . remdesivir 100 mg in NS 100 mL     PRN Meds:.acetaminophen, haloperidol lactate, HYDROmorphone HCl, LORazepam, polyethylene glycol  Antibiotics  :               Anti-infectives (From admission, onward)     Start     Dose/Rate Route Frequency Ordered Stop   10/16/20 1000  remdesivir 100 mg in sodium chloride 0.9 % 100 mL IVPB       "Followed by" Linked Group Details   100 mg 200 mL/hr over 30 Minutes Intravenous Daily 11/06/2020 2355 10/20/20 0959   10/15/20 0030  remdesivir 200 mg in sodium chloride 0.9% 250 mL IVPB       "Followed by" Linked Group Details   200 mg 580 mL/hr over 30 Minutes Intravenous Once 10/13/2020 2355 10/15/20 0520   10/25/2020 1830  vancomycin (VANCOREADY) IVPB 1750 mg/350 mL        1,750 mg 175 mL/hr over 120 Minutes Intravenous  Once 11/06/2020 1809 10/15/20 0114   10/23/2020 1819  vancomycin variable dose per unstable renal function (pharmacist dosing)  Status:  Discontinued         Does not apply See admin instructions 10/16/2020 1820 10/15/20 0300   10/25/2020 1815  ceFEPIme (MAXIPIME) 2 g in sodium chloride 0.9 % 100 mL IVPB        2 g 200 mL/hr over 30 Minutes Intravenous  Once 10/29/2020 1802 10/15/20 0114   11/03/2020 1800  ceFEPIme (MAXIPIME) 1 g in sodium chloride 0.9 % 100 mL IVPB  Status:  Discontinued        1 g 200 mL/hr over 30 Minutes Intravenous  Once 10/24/2020 1755 11/05/2020 1802         Time Spent in minutes   30   Lala Lund M.D on 10/16/2020 at 9:12 AM  To page go to [email or web address]   [facility] -  Office  [phone]  See all Orders from today for further details    Objective:         Vitals:   10/16/20 0404 10/16/20 0436 10/16/20 0545 10/16/20 0750  BP:  128/84  119/68  Pulse: 89 88  81  Resp: 20 19  (!) 24  Temp:  97.6 F (36.4 C) 98.4 F (36.9 C) 98.2 F (36.8 C)  TempSrc:  Axillary Axillary Oral  SpO2: 96% 99%  98%  Weight:      Height:  Wt Readings from Last 3 Encounters:  10/15/20 81.6 kg  09/25/20 97 kg  06/25/20 106.1 kg     Intake/Output Summary (Last 24 hours) at 10/16/2020 0912 Last data filed at 10/16/2020 0300    Gross per 24 hour  Intake 0 ml  Output 150 ml  Net -150 ml     Physical Exam  Awake but somewhat confused elderly African-American male in no distress, .AT,PERRAL Supple Neck, trach site in place with some bloody discharge Symmetrical Chest wall movement, Good air movement bilaterally, CTAB RRR,No Gallops, Rubs or new Murmurs, No Parasternal Heave +ve B.Sounds, Abd Soft, No tenderness, No organomegaly appriciated, No rebound - guarding or rigidity. No Cyanosis, Clubbing or edema, No new Rash or bruise      Data Review:    CBC Last Labs         Recent Labs  Lab 10/21/2020 1718 11/06/2020 1747 10/25/2020 1748 10/16/20 0524  WBC 7.5  --   --  5.4  HGB 7.4* 8.8* 8.5* 7.4*  HCT 23.9* 26.0* 25.0* 22.4*  PLT 269  --   --  257  MCV 83.0  --   --  81.8  MCH 25.7*  --   --  27.0  MCHC 31.0  --   --  33.0  RDW 19.5*  --   --  19.8*  LYMPHSABS 0.6*  --   --  0.3*  MONOABS 1.3*  --   --  0.5  EOSABS 0.0  --   --  0.0  BASOSABS 0.0  --   --  0.0      Last Labs            Recent Labs  Lab 10/16/2020 1718 11/08/2020 1747 10/26/2020 1748 11/05/2020 1755 10/16/2020 1805 10/15/20 1058 10/16/20 0524  NA 128* 131* 131*  --   --  129* 129*  K 4.9 5.0 5.0  --   --  5.5* 5.8*  CL 95*   --  94*  --   --  95* 94*  CO2 24  --   --   --   --  20* 20*  GLUCOSE 95  --  95  --   --  92 122*  BUN 53*  --  50*  --   --  58* 73*  CREATININE 2.75*  --  2.80*  --   --  3.19* 3.67*  CALCIUM 8.6*  --   --   --   --  8.5* 7.8*  AST 92*  --   --   --   --  92* 89*  ALT 21  --   --   --   --  21 22  ALKPHOS 364*  --   --   --   --  390* 369*  BILITOT 1.8*  --   --   --   --  2.0* 1.9*  ALBUMIN 2.0*  --   --   --   --  2.1* 1.9*  MG  --   --   --   --   --  2.3 2.5*  CRP  --   --   --   --   --  29.0* 28.1*  DDIMER  --   --   --   --   --  19.27* 13.26*  PROCALCITON  --   --   --   --   --  0.54 0.72  LATICACIDVEN  --   --   --   --  2.0*  --  1.2  INR 1.3*  --   --   --   --   --   --   BNP  --   --   --  151.5*  --  186.1* 307.7*      ------------------------------------------------------------------------------------------------------------------ Recent Labs (last 2 labs)   No results for input(s): CHOL, HDL, LDLCALC, TRIG, CHOLHDL, LDLDIRECT in the last 72 hours.    Recent Labs  No results found for: HGBA1C   ------------------------------------------------------------------------------------------------------------------  Recent Labs (last 2 labs)   No results for input(s): TSH, T4TOTAL, T3FREE, THYROIDAB in the last 72 hours.  Invalid input(s): FREET3    Cardiac Enzymes  Last Labs   No results for input(s): CKMB, TROPONINI, MYOGLOBIN in the last 168 hours.  Invalid input(s): CK   ------------------------------------------------------------------------------------------------------------------ Labs (Brief)          Component Value Date/Time   BNP 307.7 (H) 10/16/2020 0524      Micro Results        Recent Results (from the past 240 hour(s))  Resp Panel by RT-PCR (Flu A&B, Covid) Nasopharyngeal Swab     Status: Abnormal   Collection Time: 10/18/2020  5:18 PM   Specimen: Nasopharyngeal Swab; Nasopharyngeal(NP) swabs in vial transport medium   Result Value Ref Range Status   SARS Coronavirus 2 by RT PCR POSITIVE (A) NEGATIVE Final    Comment: RESULT CALLED TO, READ BACK BY AND VERIFIED WITH: A COSTALES RN 1859 11/07/2020 A BROWNING (NOTE) SARS-CoV-2 target nucleic acids are DETECTED.  The SARS-CoV-2 RNA is generally detectable in upper respiratory specimens during the acute phase of infection. Positive results are indicative of the presence of the identified virus, but do not rule out bacterial infection or co-infection with other pathogens not detected by the test. Clinical correlation with patient history and other diagnostic information is necessary to determine patient infection status. The expected result is Negative.  Fact Sheet for Patients: EntrepreneurPulse.com.au  Fact Sheet for Healthcare Providers: IncredibleEmployment.be  This test is not yet approved or cleared by the [place] FDA and  has been authorized for detection and/or diagnosis of SARS-CoV-2 by FDA under an Emergency Use Authorization (EUA).  This EUA will remain in effect (meaning this test can  be used) for the duration of  the COVID-19 declaration under Section 564(b)(1) of the Act, 21 U.S.C. section 360bbb-3(b)(1), unless the authorization is terminated or revoked sooner.     Influenza A by PCR NEGATIVE NEGATIVE Final   Influenza B by PCR NEGATIVE NEGATIVE Final    Comment: (NOTE) The Xpert Xpress SARS-CoV-2/FLU/RSV plus assay is intended as an aid in the diagnosis of influenza from Nasopharyngeal swab specimens and should not be used as a sole basis for treatment. Nasal washings and aspirates are unacceptable for Xpert Xpress SARS-CoV-2/FLU/RSV testing.  Fact Sheet for Patients: EntrepreneurPulse.com.au  Fact Sheet for Healthcare Providers: IncredibleEmployment.be  This test is not yet approved or cleared by the [place] FDA and has been  authorized for detection and/or diagnosis of SARS-CoV-2 by FDA under an Emergency Use Authorization (EUA). This EUA will remain in effect (meaning this test can be used) for the duration of the COVID-19 declaration under Section 564(b)(1) of the Act, 21 U.S.C. section 360bbb-3(b)(1), unless the authorization is terminated or revoked.  Performed at [facility], [place] [address], [place], [place] [address]   MRSA PCR Screening     Status: None   Collection Time: 10/15/20  8:56 PM  Result Value Ref  Range Status   MRSA by PCR NEGATIVE NEGATIVE Final    Comment:        The GeneXpert MRSA Assay (FDA approved for NASAL specimens only), is one component of a comprehensive MRSA colonization surveillance program. It is not intended to diagnose MRSA infection nor to guide or monitor treatment for MRSA infections. Performed at [facility], [address], [place], [place] [address]     Radiology Reports  Imaging Results  CT Head Wo Contrast  Result Date: 10-24-2020 CLINICAL DATA:  Altered mental status. EXAM: CT HEAD WITHOUT CONTRAST TECHNIQUE: Contiguous axial images were obtained from the base of the skull through the vertex without intravenous contrast. COMPARISON:  None. FINDINGS: Brain: There is mild cerebral atrophy with widening of the extra-axial spaces and ventricular dilatation. There are areas of decreased attenuation within the white matter tracts of the supratentorial brain, consistent with microvascular disease changes. CSF attenuation is seen within the region of the sella. Vascular: No hyperdense vessel or unexpected calcification. Skull: Normal. Negative for fracture or focal lesion. Sinuses/Orbits: A 7 mm x 9 mm right-sided frontal sinus polyp versus mucous retention cyst. Other: A 9 mm x 5 mm benign-appearing, well-defined calcified soft tissue nodule is seen within the posterior parietal scalp on the left. IMPRESSION: 1. Generalized cerebral atrophy,  without evidence of an acute intracranial abnormality. 2. CSF attenuation within the region of the sella which may represent an empty sella. MRI correlation is recommended. Electronically Signed   By: Aram Candela M.D.   On: 10/24/2020 22:48   US Abdomen Complete  Result Date: 10/15/2020 CLINICAL DATA:  Acute renal insufficiency, elevated liver function tests EXAM: ABDOMEN ULTRASOUND COMPLETE COMPARISON:  None FINDINGS: Gallbladder: The gallbladder is partially decompressed, but is otherwise unremarkable. No intraluminal stones or sludge is identified. Common bile duct: Diameter: 5 mm Liver: There are multiple heterogeneously hyper to hypoechoic masses identified throughout the liver, more prevalent within the left hepatic lobe, but scattered throughout both hepatic lobes. The dominant mass is seen within the left hepatic lobe measuring 7.4 x 5.8 x 6.6 cm and within the right hepatic lobe measuring 6.2 x 6.2 x 7.4 cm. The liver contour appears smooth. There is no intrahepatic biliary ductal dilation. Portal vein is patent on color Doppler imaging with normal direction of blood flow towards the liver. IVC: No abnormality visualized. Pancreas: Visualized portion unremarkable. Spleen: Size and appearance within normal limits. Right Kidney: Length: 10.9 cm. Echogenicity within normal limits. No mass or hydronephrosis visualized. Left Kidney: Length: 13.7 cm. Echogenicity within normal limits. No mass or hydronephrosis visualized. Abdominal aorta: No aneurysm visualized. The distal aorta is obscured by overlying bowel gas. Other findings: None. IMPRESSION: Multiple hepatic masses suspicious for a metastatic disease. Alternatively, this could represent a primary malignant process such as multifocal hepatocellular carcinoma. Dedicated contrast enhanced CT examination of the abdomen and pelvis is recommended for further evaluation. Electronically Signed   By: Helyn Numbers MD   On: 10/15/2020 04:20   DG Chest  Port 1 View  Result Date: 10/16/2020 CLINICAL DATA:  Shortness of breath EXAM: PORTABLE CHEST 1 VIEW COMPARISON:  Chest x-rays dated 10/15/2020 07/24/2021. FINDINGS: Stable marked cardiomegaly. Tracheostomy tube appears appropriately positioned in the midline. Median sternotomy wires appear intact and stable in alignment. Increasingly dense opacity at the LEFT lung base. Subtle airspace opacity at the RIGHT lung base is unchanged. IMPRESSION: 1. Bibasilar airspace opacities, increased on the LEFT, multifocal pneumonia versus atelectasis. Cannot exclude small bilateral pleural effusions.  2. Stable marked cardiomegaly. Stable central pulmonary vascular congestion, likely chronic. Electronically Signed   By: Franki Cabot M.D.   On: 10/16/2020 07:59   DG Chest Port 1 View  Result Date: 10/15/2020 CLINICAL DATA:  Shortness of breath.  COVID. EXAM: PORTABLE CHEST 1 VIEW COMPARISON:  Prior chest radiographs 10/11/2020. FINDINGS: Tracheostomy in place. Prior median sternotomy/CABG. Unchanged cardiomegaly. Central pulmonary vascular congestion. Persistent opacities within the bilateral lung bases. No sizable pleural effusion or evidence of pneumothorax. No acute bony abnormality is identified. IMPRESSION: Persistent opacities within the lung bases suspicious for pneumonia given the provided history. However, atelectasis is also a consideration. Unchanged cardiomegaly with central pulmonary vascular congestion. Electronically Signed   By: Kellie Simmering DO   On: 10/15/2020 08:06   DG Chest Port 1 View  Result Date: 10/22/2020 CLINICAL DATA:  Sepsis? Shortness of breath EXAM: PORTABLE CHEST 1 VIEW COMPARISON:  09/25/2020 FINDINGS: Unchanged cardiomegaly. Scattered opacities at the lung bases favored to be atelectasis rather than pneumonia. 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 The tracheostomy cannula again identified. IMPRESSION: Patchy opacities at the lung bases favored to be atelectasis rather than pneumonia.  Electronically Signed   By: Miachel Roux M.D.   On: 11/03/2020 15:59   DG CHEST PORT 1 VIEW  Result Date: 09/25/2020 CLINICAL DATA:  Bloody sputum.  Tracheostomy. EXAM: PORTABLE CHEST 1 VIEW COMPARISON:  07/21/2020 FINDINGS: Tracheostomy tube is seen in expected position. Prior CABG again noted. Cardiomegaly and pulmonary vascular congestion remains stable. New opacity is seen in the medial left lower lobe, suspicious for pneumonia. Right lung is clear. No evidence of pleural effusion. IMPRESSION: New opacity in medial left lower lobe, suspicious for pneumonia. Stable cardiomegaly and pulmonary vascular congestion. Electronically Signed   By: Marlaine Hind M.D.   On: 09/25/2020 11:43              Electronically signed by Thurnell Lose, MD at 10/16/2020 9:16 AM    ED to Hosp-Admission (Current) on 10/28/2020      ED to Hosp-Admission (Current) on 10/22/2020         Detailed Report       Note shared with patient     Care Timeline  01/07  Admitted from ED 2002

## 2020-11-09 NOTE — Progress Notes (Signed)
Patients transferred to morgue via nurse aides. No belongings in room. Family given instructions and bed placement information to notify of funeral arrangements.

## 2020-11-09 DEATH — deceased

## 2021-10-10 IMAGING — DX DG CHEST 1V PORT
1 series · 1 of 1 positions shown · non-contrast
Comparison: 06/24/2020

CLINICAL DATA: CHF

EXAM:
PORTABLE CHEST 1 VIEW

[chest ap]
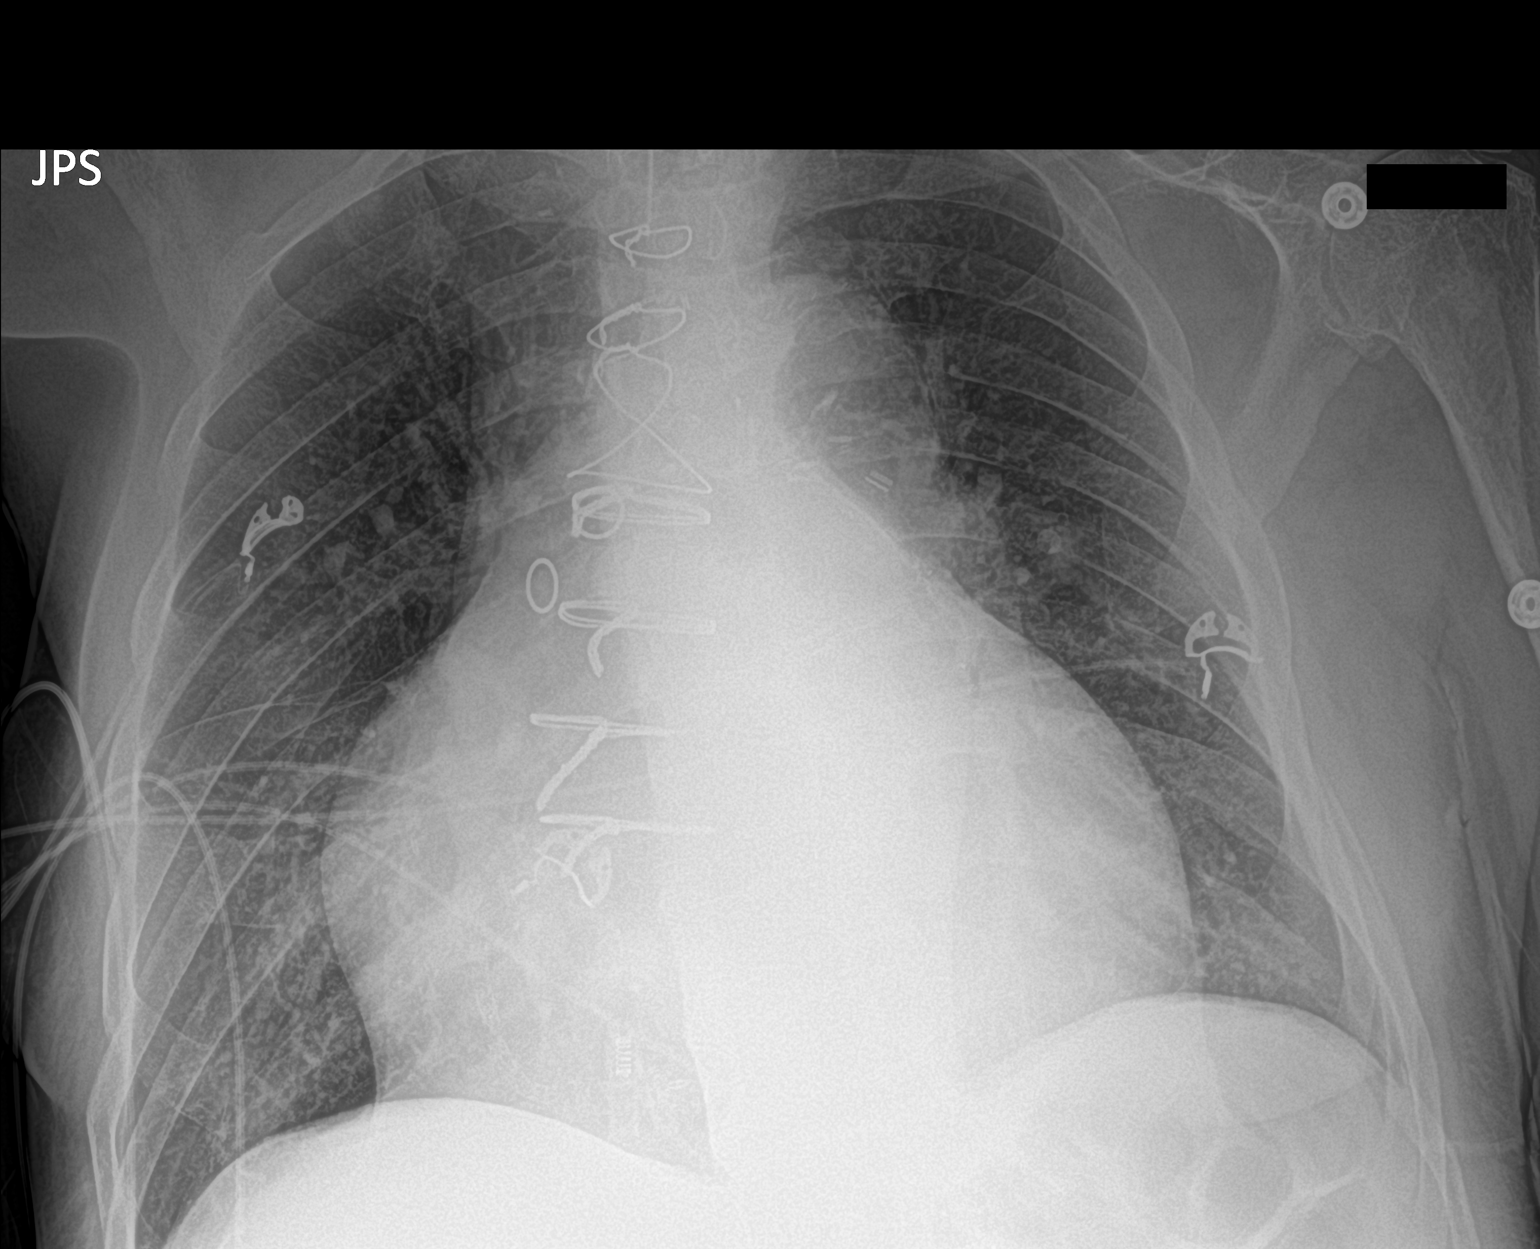

[1 of 1 positions shown; findings below may reference images not displayed]

FINDINGS: Cardiomegaly. Mild vascular congestion. Prior CABG. No overt edema
or confluent opacity. No effusion. No acute bony abnormality.
IMPRESSION: Cardiomegaly.  Mild vascular congestion.

## 2021-10-10 IMAGING — DX DG HIP (WITH OR WITHOUT PELVIS) 1V PORT*R*
3 series · 3 of 3 positions shown · non-contrast
Comparison: Intraoperative images today.

CLINICAL DATA: 69-year-old male status post right femur ORIF.

EXAM:
DG HIP (WITH OR WITHOUT PELVIS) 1V PORT RIGHT

[pelvis ap]
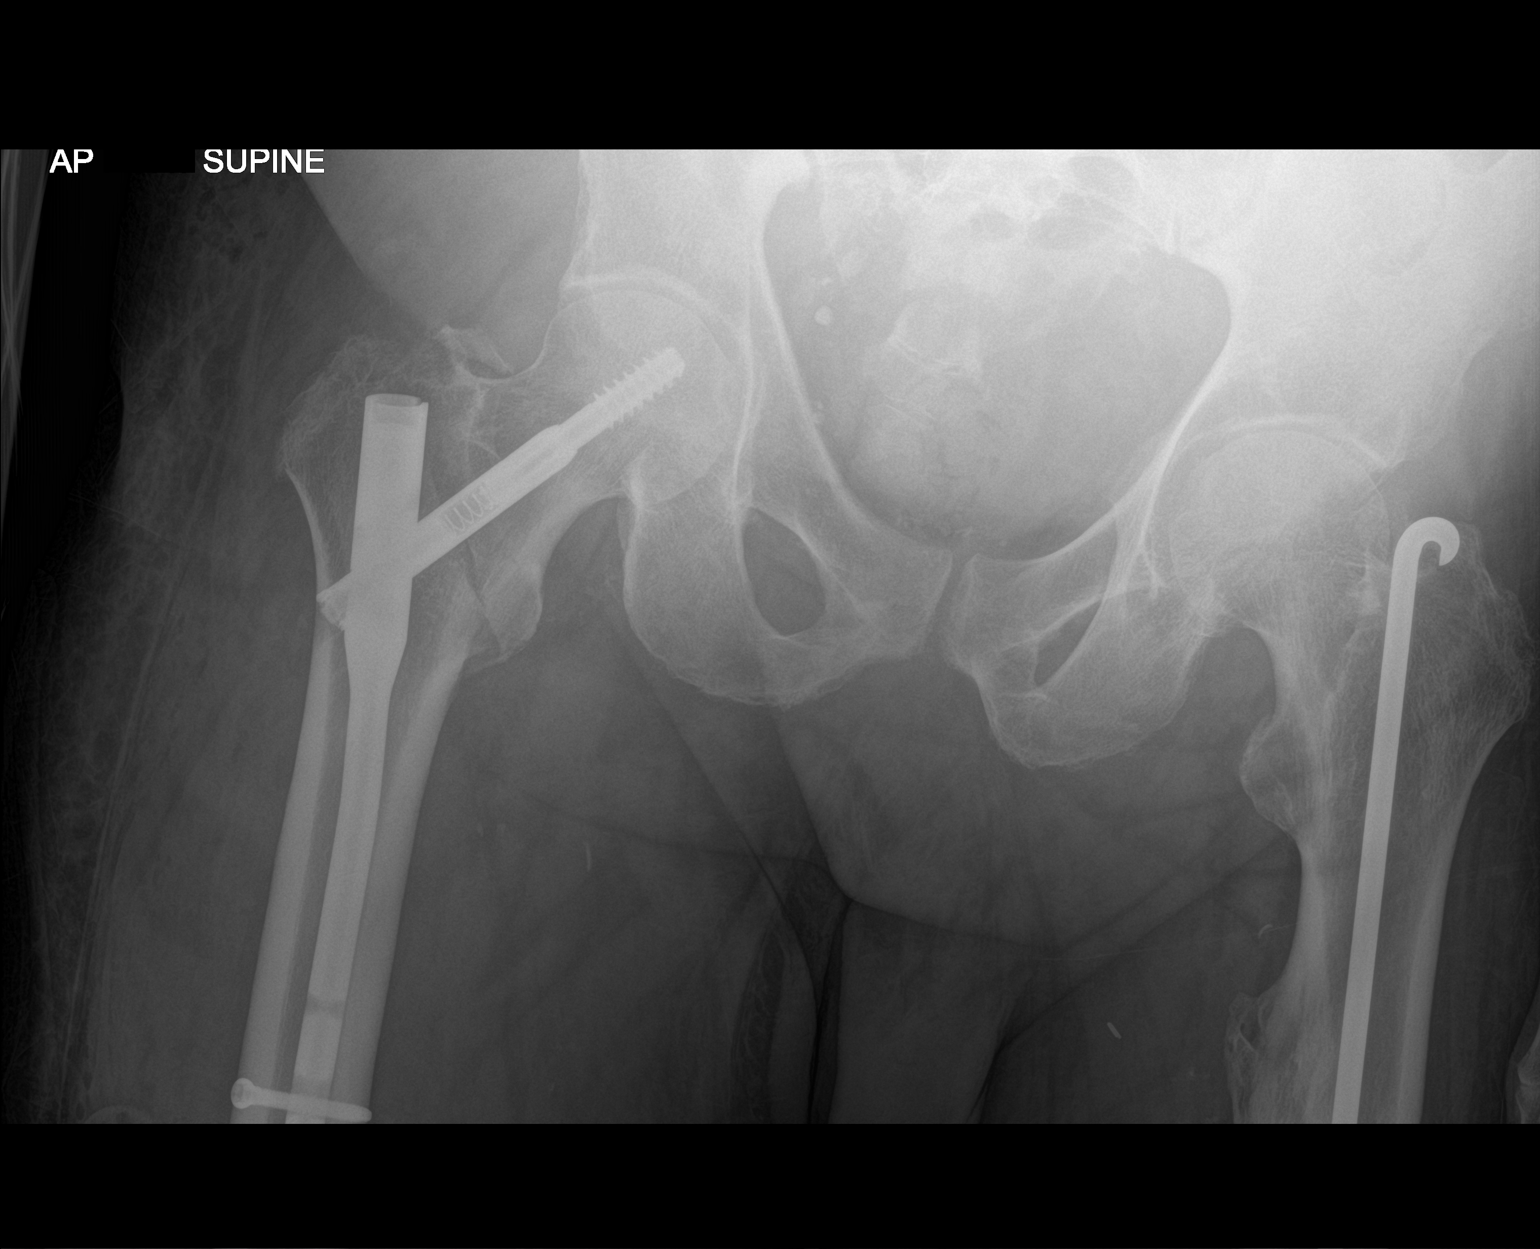

[hip lat (1 of 2)]
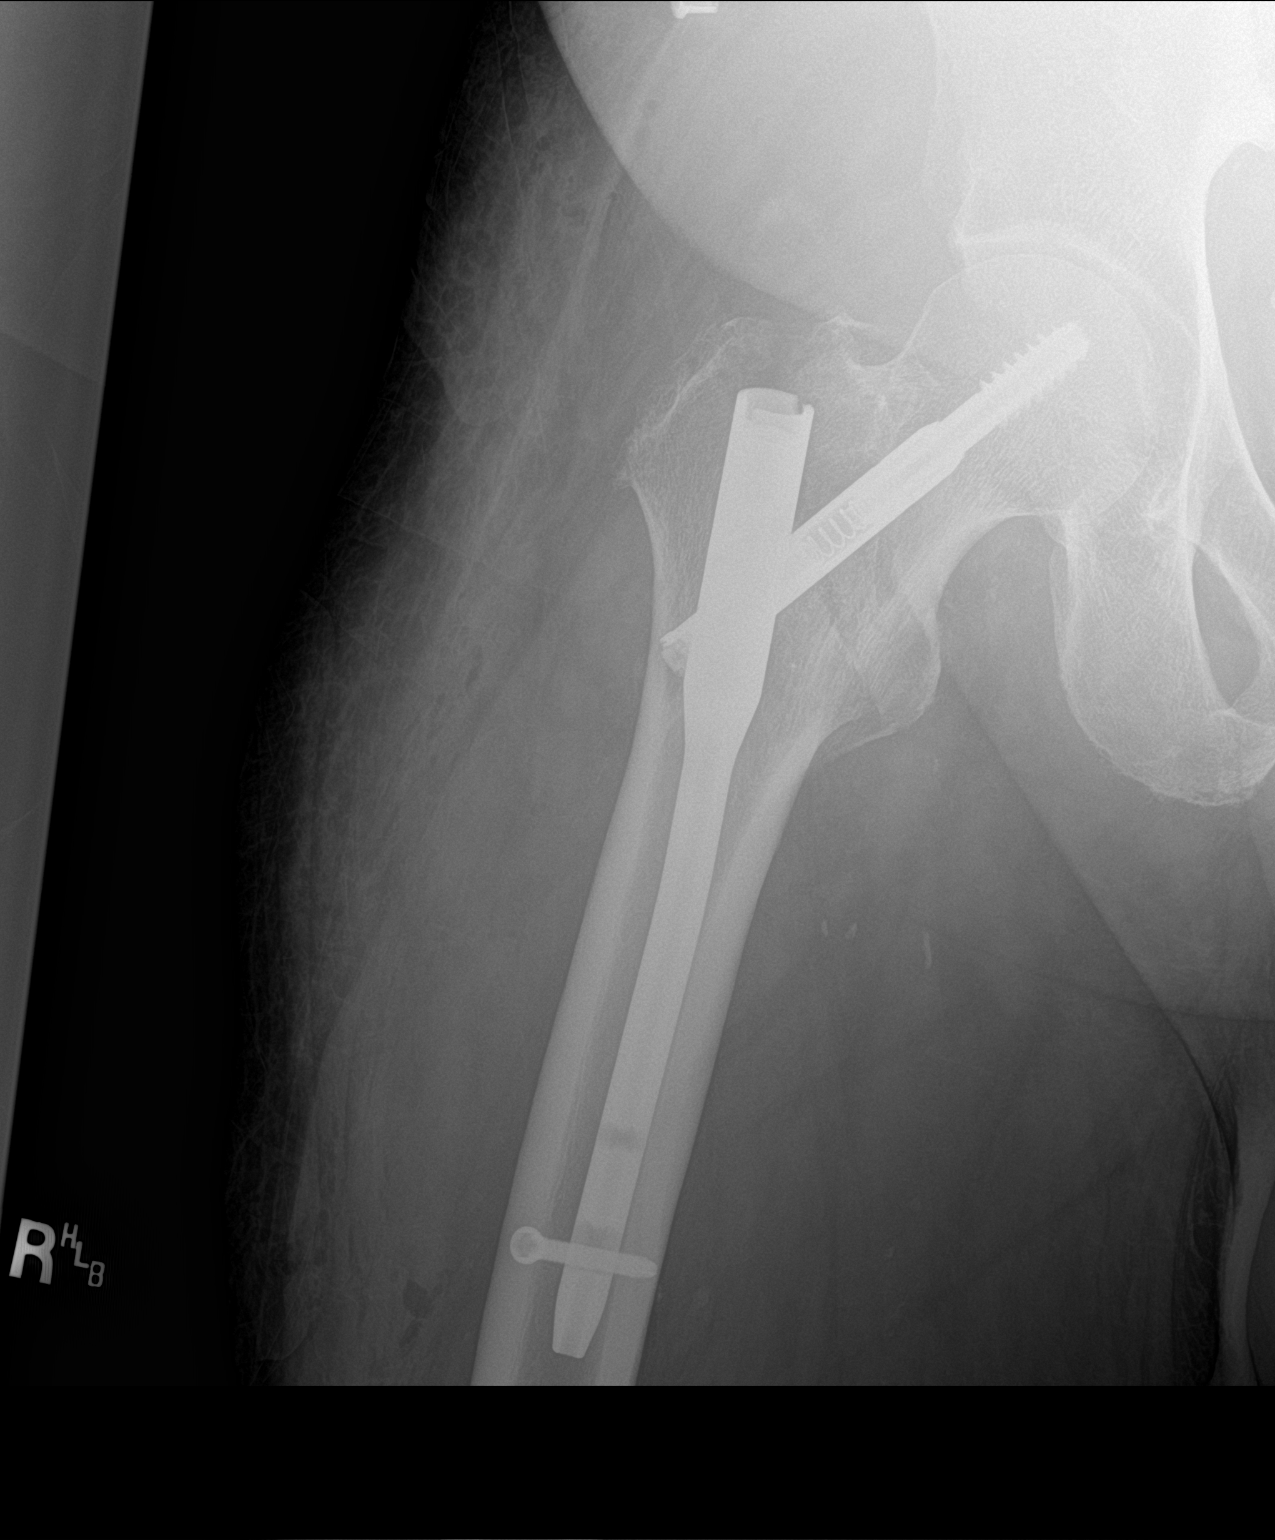

[hip lat (2 of 2)]
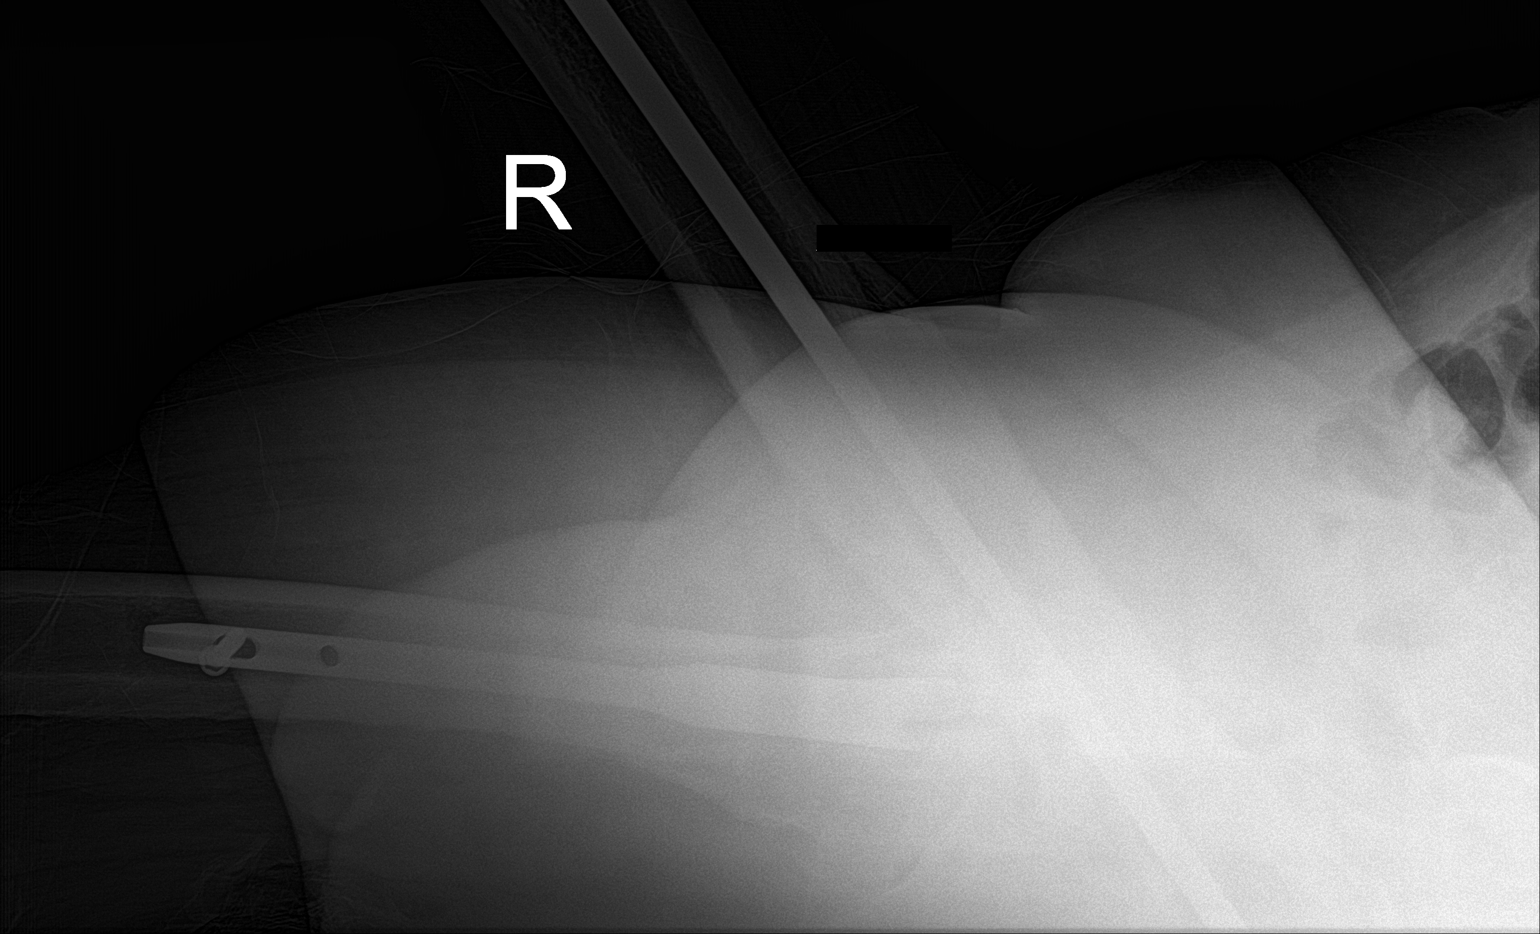

[3 of 3 positions shown; findings below may reference images not displayed]

FINDINGS: Portable AP supine views at 3024 hours. Right femur intramedullary
rod with proximal interlocking dynamic hip screw and distal
interlocking cortical screw. Stable alignment about the
intertrochanteric fracture, near anatomic. Chronic deformity of the
visible left femur with intramedullary rod appears stable. No new
osseous abnormality.
IMPRESSION: Right femur ORIF today with no adverse features.

## 2021-10-10 IMAGING — RF DG C-ARM 1-60 MIN
1 series · 4 of 4 positions shown · non-contrast
Comparison: 06/24/2020

CLINICAL DATA: ORIF right femoral fracture

EXAM:
DG C-ARM 1-60 MIN; RIGHT FEMUR 2 VIEWS
FLUOROSCOPY TIME:  Fluoroscopy Time:  55 seconds
Radiation Exposure Index (if provided by the fluoroscopic device):
8.61 mGy
Number of Acquired Spot Images: 4 images

[Series 1: run · 4 of 4 slices shown]
[im 1/4]
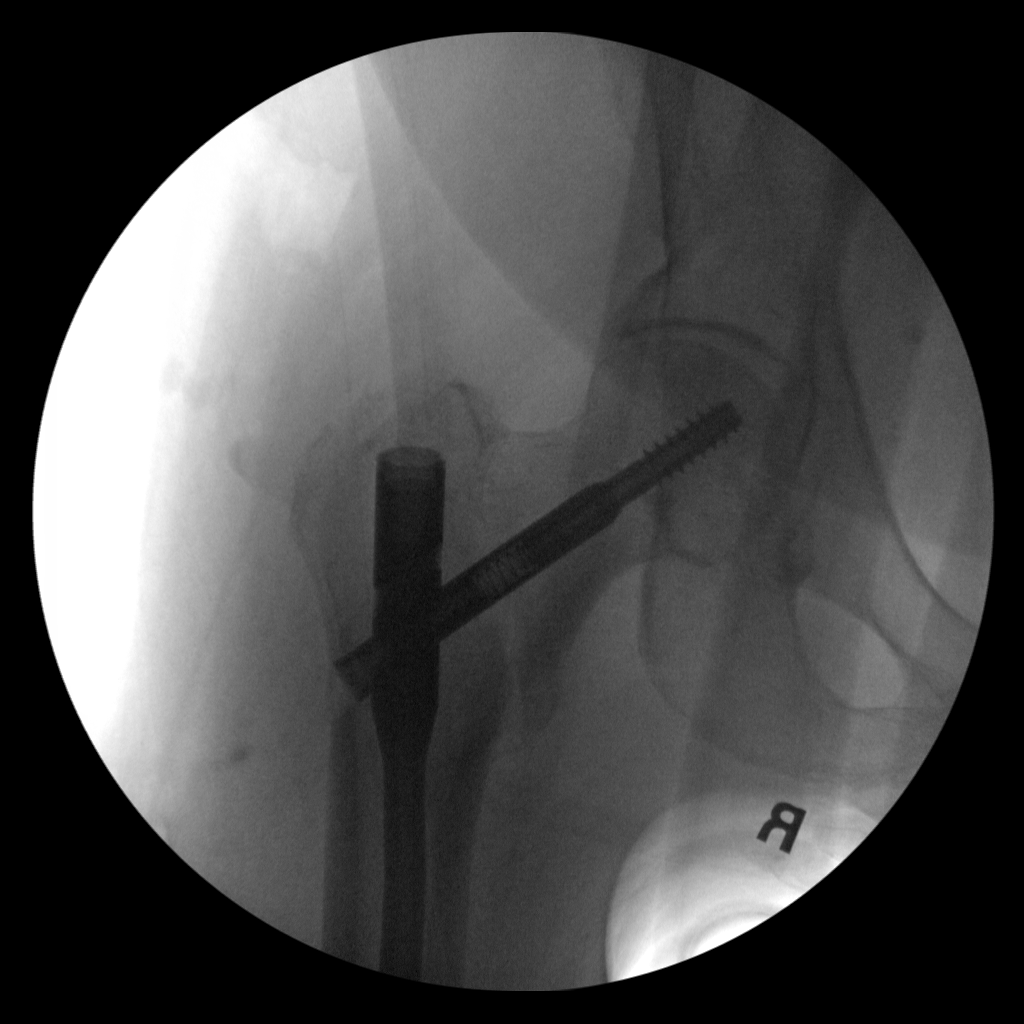
[im 2/4]
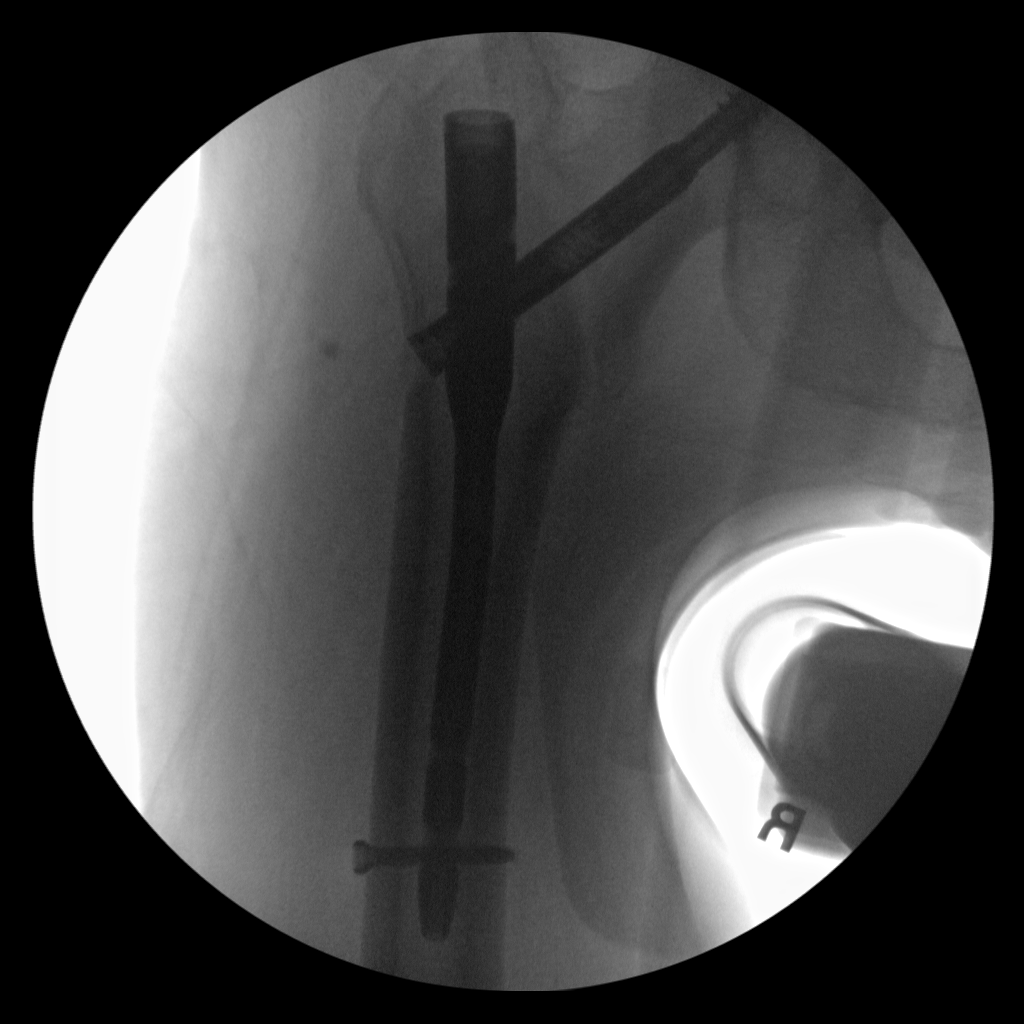
[im 3/4]
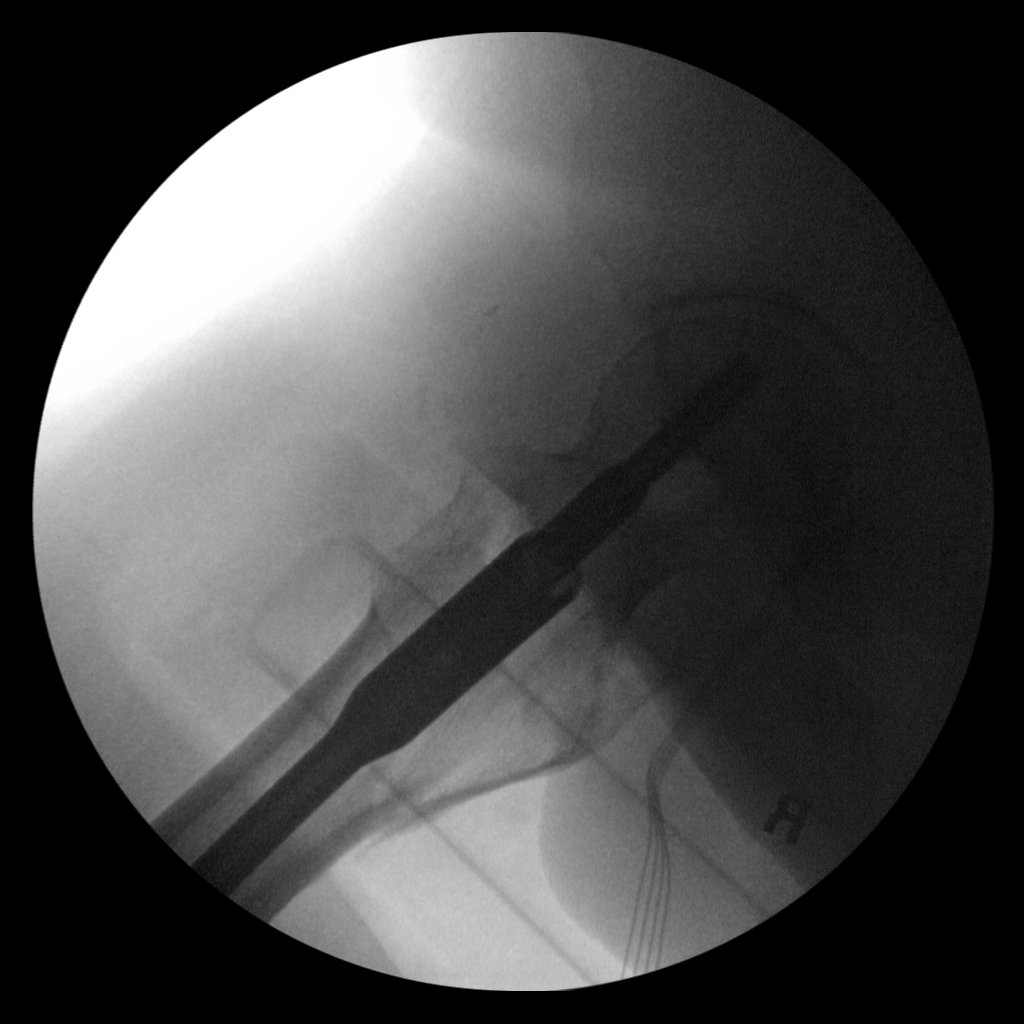
[im 4/4]
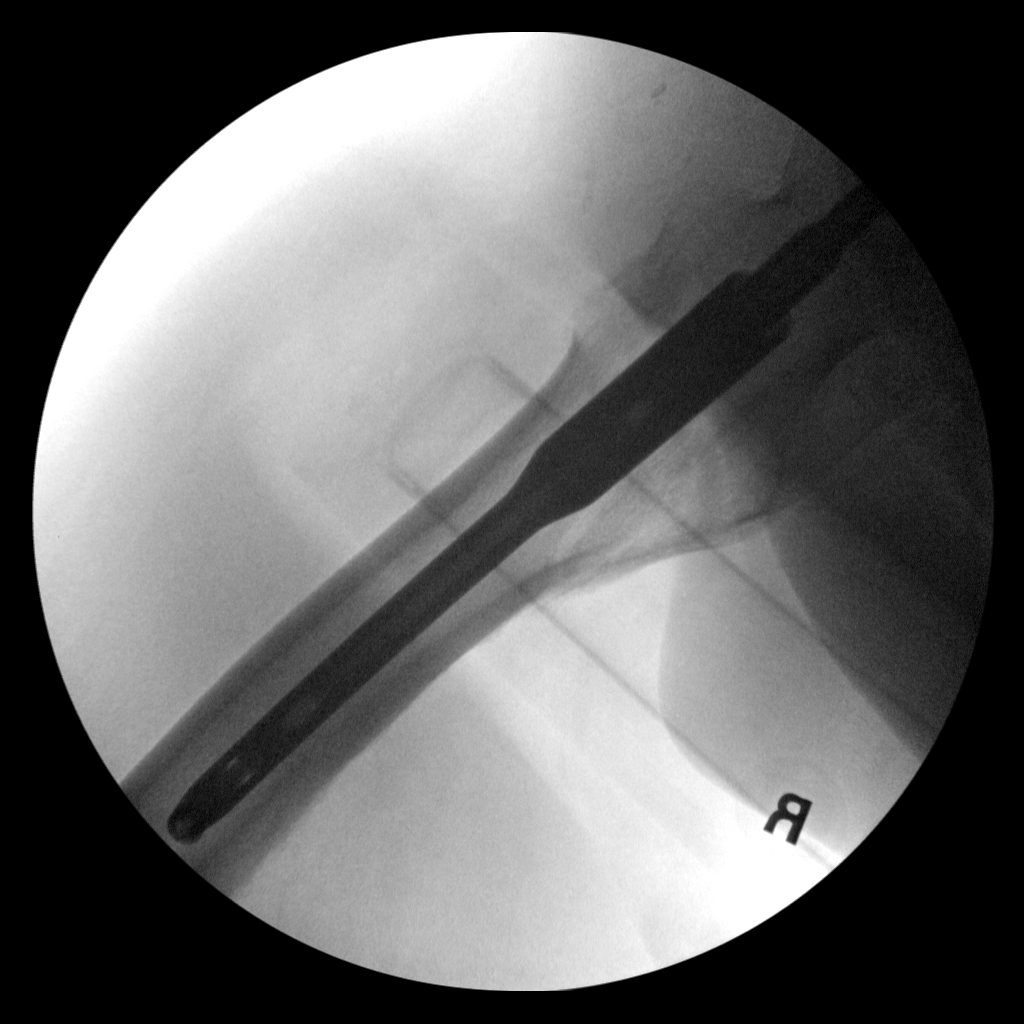

[4 of 4 positions shown; findings below may reference images not displayed]

FINDINGS: 4 fluoroscopic images are obtained during the performance of the
procedure and are provided for interpretation only. Intramedullary
rod with dynamic screw and distal interlocking screw traverses a
comminuted intertrochanteric right hip fracture. Alignment is near
anatomic.
IMPRESSION: 1. ORIF right hip fracture as above.

## 2021-10-10 IMAGING — RF DG FEMUR 2+V*R*
1 series · 4 of 4 positions shown · non-contrast
Comparison: 06/24/2020

CLINICAL DATA: ORIF right femoral fracture

EXAM:
DG C-ARM 1-60 MIN; RIGHT FEMUR 2 VIEWS
FLUOROSCOPY TIME:  Fluoroscopy Time:  55 seconds
Radiation Exposure Index (if provided by the fluoroscopic device):
8.61 mGy
Number of Acquired Spot Images: 4 images

[Series 1: run · 4 of 4 slices shown]
[im 1/4]
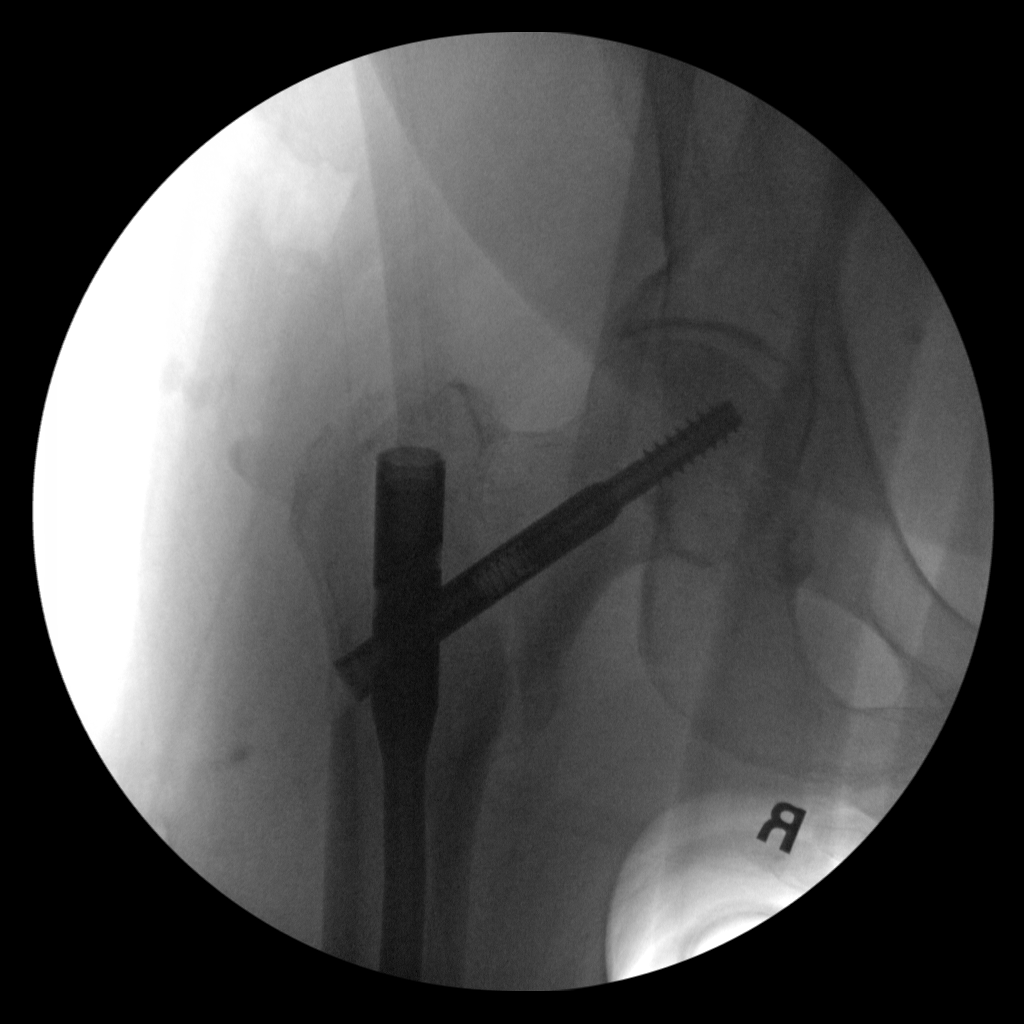
[im 2/4]
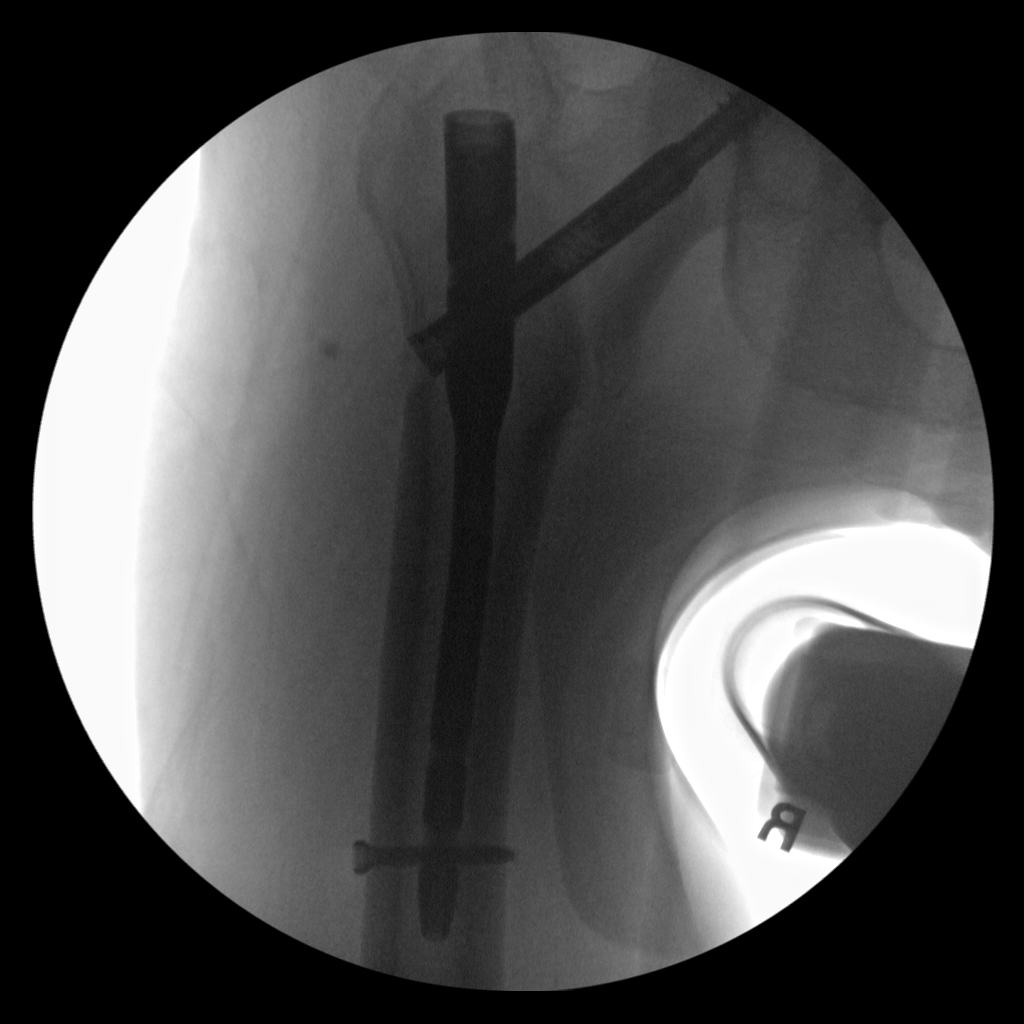
[im 3/4]
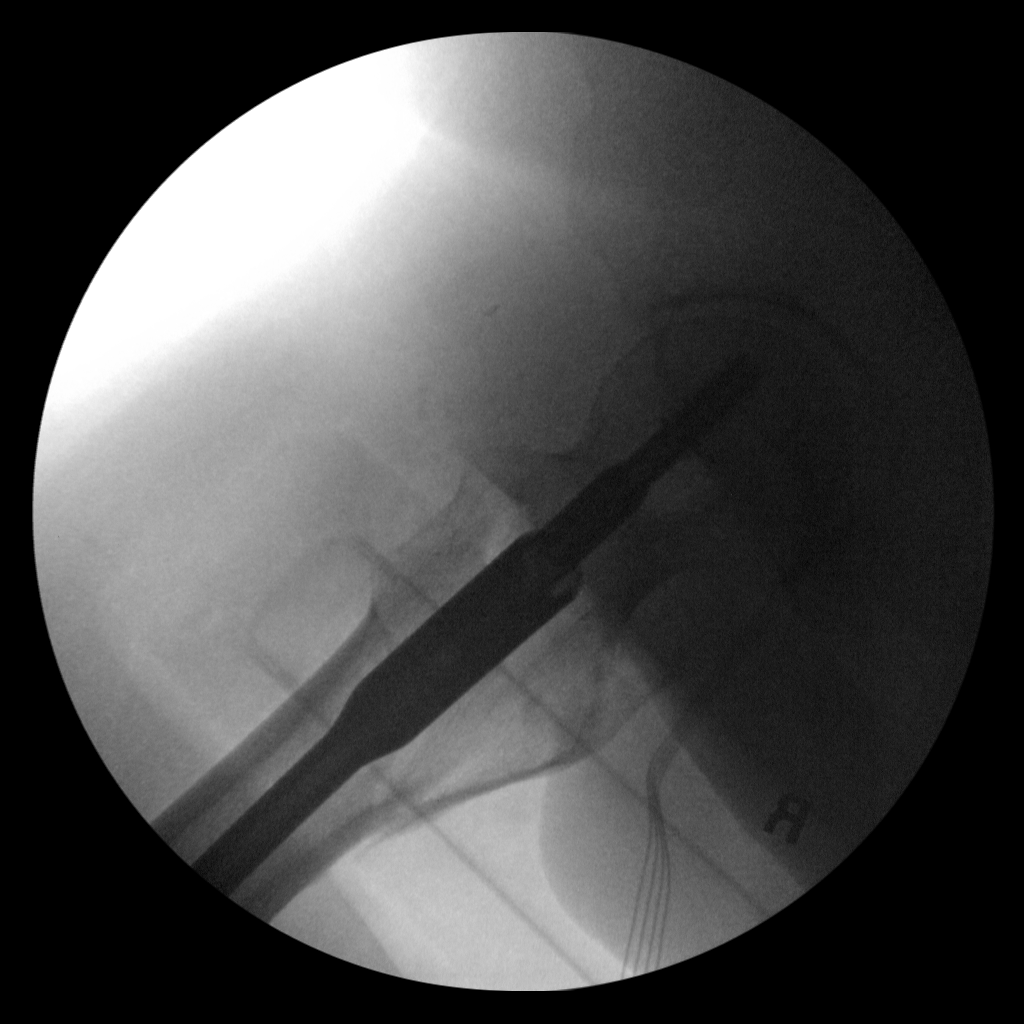
[im 4/4]
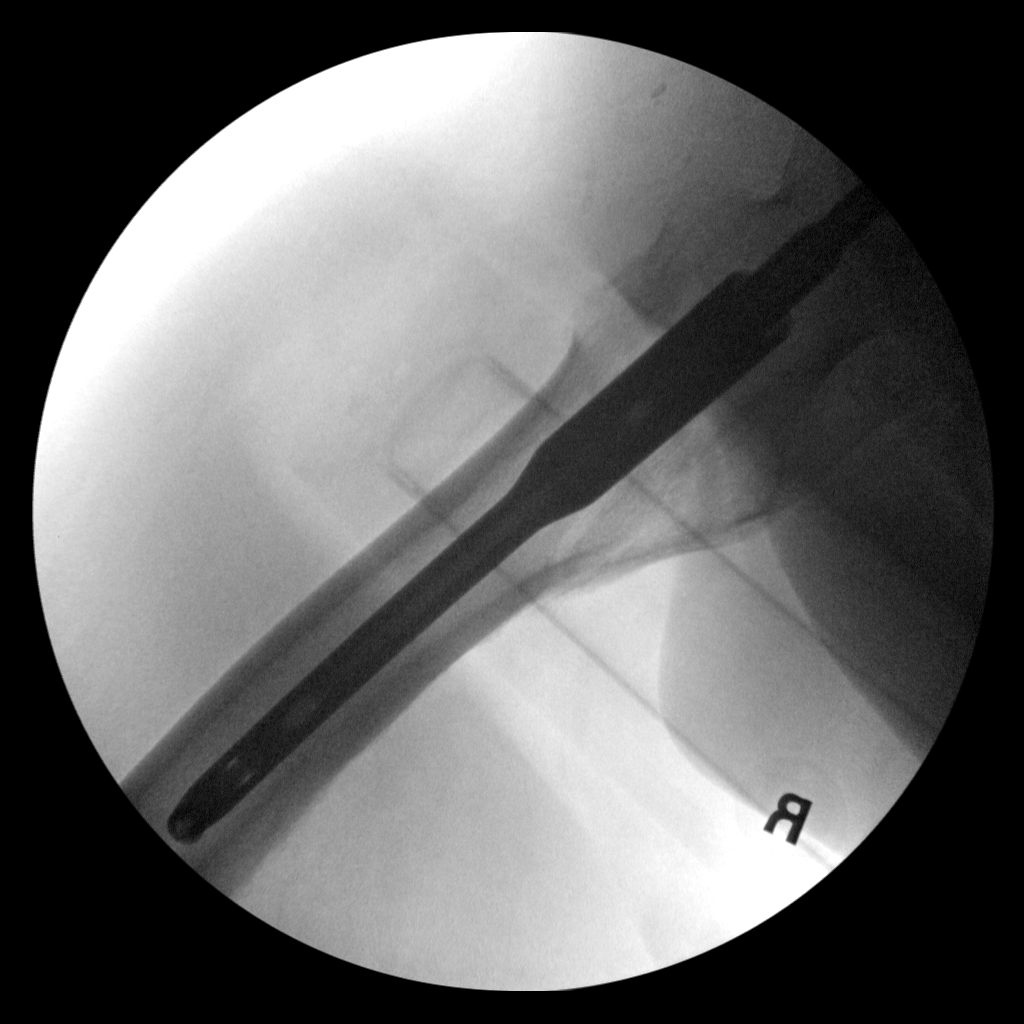

[4 of 4 positions shown; findings below may reference images not displayed]

FINDINGS: 4 fluoroscopic images are obtained during the performance of the
procedure and are provided for interpretation only. Intramedullary
rod with dynamic screw and distal interlocking screw traverses a
comminuted intertrochanteric right hip fracture. Alignment is near
anatomic.
IMPRESSION: 1. ORIF right hip fracture as above.
